# Patient Record
Sex: Female | Born: 1980 | Race: White | Hispanic: No | Marital: Married | Smoking: Never smoker
Health system: Southern US, Community
[De-identification: ages and names within clinical notes are randomized; demographics above are authoritative.]

## PROBLEM LIST (undated history)

## (undated) DIAGNOSIS — Z9889 Other specified postprocedural states: Secondary | ICD-10-CM

## (undated) DIAGNOSIS — S92902A Unspecified fracture of left foot, initial encounter for closed fracture: Secondary | ICD-10-CM

## (undated) DIAGNOSIS — E049 Nontoxic goiter, unspecified: Secondary | ICD-10-CM

## (undated) DIAGNOSIS — R112 Nausea with vomiting, unspecified: Secondary | ICD-10-CM

## (undated) DIAGNOSIS — L659 Nonscarring hair loss, unspecified: Secondary | ICD-10-CM

## (undated) DIAGNOSIS — E161 Other hypoglycemia: Secondary | ICD-10-CM

## (undated) DIAGNOSIS — F419 Anxiety disorder, unspecified: Secondary | ICD-10-CM

## (undated) HISTORY — DX: Unspecified fracture of left foot, initial encounter for closed fracture: S92.902A

## (undated) HISTORY — PX: CHOLECYSTECTOMY: SHX55

## (undated) HISTORY — PX: FINGER NAIL SURGERY: SHX717

## (undated) HISTORY — PX: CYSTECTOMY: SUR359

## (undated) HISTORY — DX: Nontoxic goiter, unspecified: E04.9

## (undated) HISTORY — PX: TONSILLECTOMY: SUR1361

## (undated) HISTORY — DX: Nonscarring hair loss, unspecified: L65.9

## (undated) HISTORY — PX: REFRACTIVE SURGERY: SHX103

## (undated) HISTORY — DX: Anxiety disorder, unspecified: F41.9

---

## 2010-06-01 ENCOUNTER — Encounter: Admission: RE | Admit: 2010-06-01 | Discharge: 2010-06-01 | Payer: Self-pay | Admitting: Obstetrics and Gynecology

## 2010-11-28 ENCOUNTER — Encounter
Admission: RE | Admit: 2010-11-28 | Discharge: 2010-11-28 | Payer: Self-pay | Source: Home / Self Care | Admitting: Endocrinology

## 2011-04-23 ENCOUNTER — Inpatient Hospital Stay (HOSPITAL_COMMUNITY): Admission: AD | Admit: 2011-04-23 | Payer: Self-pay | Source: Home / Self Care | Admitting: Obstetrics and Gynecology

## 2011-05-07 ENCOUNTER — Other Ambulatory Visit: Payer: Self-pay | Admitting: Orthopedic Surgery

## 2011-05-07 ENCOUNTER — Ambulatory Visit (HOSPITAL_BASED_OUTPATIENT_CLINIC_OR_DEPARTMENT_OTHER)
Admission: RE | Admit: 2011-05-07 | Discharge: 2011-05-07 | Disposition: A | Discharge status: Home / Self Care | Payer: Private Health Insurance - Indemnity | Source: Ambulatory Visit | Attending: Orthopedic Surgery | Admitting: Orthopedic Surgery

## 2011-05-07 DIAGNOSIS — Z01812 Encounter for preprocedural laboratory examination: Secondary | ICD-10-CM | POA: Insufficient documentation

## 2011-05-07 DIAGNOSIS — L608 Other nail disorders: Secondary | ICD-10-CM | POA: Insufficient documentation

## 2011-07-12 NOTE — Op Note (Signed)
  [name], Melissa Sawyer              ACCOUNT NO.:  [id number]  MEDICAL RECORD NO.:  [id number]           PATIENT TYPE:  LOCATION:                                FACILITY:  [facility]  PHYSICIAN:  Cindee Salt, M.D.       DATE OF BIRTH:  July 04, 1981  DATE OF PROCEDURE:  05/07/2011 DATE OF DISCHARGE:                              OPERATIVE REPORT   PREOPERATIVE DIAGNOSIS:  Melanotic lesion, left thumb nail.  POSTOPERATIVE DIAGNOSIS:  Melanotic lesion, left thumb nail.  OPERATION:  Biopsy, left thumb nail bed.  SURGEON:  Cindee Salt, MD  ANESTHESIA:  Forearm based IV regional local infiltration.  ANESTHESIOLOGIST:  Janetta Hora. Frederick, MD  HISTORY:  The patient is a 30 year old female with a history of a melanotic streak in her nail plate of her left thumb.  This has grown to the entire length of the nail plate.  She is admitted for biopsy.  Her pre, peri, and postoperative course have been discussed along with risks and complications.  She is aware of the potential for malignancy that this may be a simple melanotic streak without undue concern but biopsy is necessary.  In the preoperative area, the patient is seen.  The extremity marked by both the patient and surgeon.  PROCEDURE IN DETAIL:  The patient was brought to the operating room where a forearm based IV regional anesthetic was carried out without difficulty.  She was prepped using ChloraPrep, supine position, left arm free.  A 3-minute dry time was allowed.  Time-out taken confirming the patient and procedure.  A metacarpal block was given with 0.25% Marcaine without epinephrine, 7 mL was used.  The nail plate was removed removing the melanotic streak in the plate distally.  The melanotic lesion was identified in the proximal nail fold.  A elliptical incision was made to the entire depths of the bone.  The specimen was sent to pathology.  The area was then undermined with a new scalpel blade irrigated and closed with interrupted  6-0 chromic sutures.  A nonadherent gauze was placed into the nail fold proximally.  A sterile compressive dressing and splint applied to the thumb.  On deflation of the tourniquet, all fingers immediately pinked.  She was taken to the recovery room for observation in satisfactory condition.  She will be discharged home to return to the [facility] [place] in 1 week on Vicodin.         ______________________________ Cindee Salt, M.D.    GK/MEDQ  D:  05/07/2011  T:  05/07/2011  Job:  [id number]  Electronically Signed by Cindee Salt M.D. on 07/12/2011 09:12:51 AM

## 2013-03-16 ENCOUNTER — Telehealth: Payer: Self-pay | Admitting: Certified Nurse Midwife

## 2013-03-16 NOTE — Telephone Encounter (Signed)
pt. states she will call back to rs consult for 03/17/13 at a later time. Pt. stated she can not afford $145.00 for consult.

## 2013-03-17 ENCOUNTER — Institutional Professional Consult (permissible substitution): Payer: Self-pay | Admitting: Certified Nurse Midwife

## 2013-05-20 ENCOUNTER — Other Ambulatory Visit: Payer: Self-pay | Admitting: Endocrinology

## 2013-05-20 DIAGNOSIS — E041 Nontoxic single thyroid nodule: Secondary | ICD-10-CM

## 2013-05-20 DIAGNOSIS — E049 Nontoxic goiter, unspecified: Secondary | ICD-10-CM

## 2013-05-27 ENCOUNTER — Ambulatory Visit
Admission: RE | Admit: 2013-05-27 | Discharge: 2013-05-27 | Disposition: A | Discharge status: Home / Self Care | Payer: Managed Care, Other (non HMO) | Source: Ambulatory Visit | Attending: Endocrinology | Admitting: Endocrinology

## 2013-05-27 DIAGNOSIS — E049 Nontoxic goiter, unspecified: Secondary | ICD-10-CM

## 2013-06-22 ENCOUNTER — Encounter: Payer: Self-pay | Admitting: Certified Nurse Midwife

## 2013-06-23 ENCOUNTER — Encounter: Payer: Self-pay | Admitting: Certified Nurse Midwife

## 2013-06-23 ENCOUNTER — Ambulatory Visit (INDEPENDENT_AMBULATORY_CARE_PROVIDER_SITE_OTHER): Payer: Managed Care, Other (non HMO) | Admitting: Certified Nurse Midwife

## 2013-06-23 VITALS — BP 100/64 | HR 64 | Resp 16 | Ht 70.0 in | Wt 140.0 lb

## 2013-06-23 DIAGNOSIS — Z0000 Encounter for general adult medical examination without abnormal findings: Secondary | ICD-10-CM

## 2013-06-23 DIAGNOSIS — Z23 Encounter for immunization: Secondary | ICD-10-CM

## 2013-06-23 DIAGNOSIS — Z01419 Encounter for gynecological examination (general) (routine) without abnormal findings: Secondary | ICD-10-CM

## 2013-06-23 DIAGNOSIS — F411 Generalized anxiety disorder: Secondary | ICD-10-CM

## 2013-06-23 MED ORDER — ESCITALOPRAM OXALATE 10 MG PO TABS: 10.0000 mg | ORAL_TABLET | Freq: Every day | ORAL | Status: DC

## 2013-06-23 NOTE — Patient Instructions (Signed)
General topics  Next pap or exam is  due in 1 year Take a Women's multivitamin Take 1200 mg. of calcium daily - prefer dietary If any concerns in interim to call back  Breast Self-Awareness Practicing breast self-awareness may pick up problems early, prevent significant medical complications, and possibly save your life. By practicing breast self-awareness, you can become familiar with how your breasts look and feel and if your breasts are changing. This allows you to notice changes early. It can also offer you some reassurance that your breast health is good. One way to learn what is normal for your breasts and whether your breasts are changing is to do a breast self-exam. If you find a lump or something that was not present in the past, it is best to contact your caregiver right away. Other findings that should be evaluated by your caregiver include nipple discharge, especially if it is bloody; skin changes or reddening; areas where the skin seems to be pulled in (retracted); or new lumps and bumps. Breast pain is seldom associated with cancer (malignancy), but should also be evaluated by a caregiver. BREAST SELF-EXAM The best time to examine your breasts is 5 7 days after your menstrual period is over.  ExitCare Patient Information 2013 [employer].   Exercise to Stay Healthy Exercise helps you become and stay healthy. EXERCISE IDEAS AND TIPS Choose exercises that:  You enjoy.  Fit into your day. You do not need to exercise really hard to be healthy. You can do exercises at a slow or medium level and stay healthy. You can:  Stretch before and after working out.  Try yoga, Pilates, or tai chi.  Lift weights.  Walk fast, swim, jog, run, climb stairs, bicycle, dance, or rollerskate.  Take aerobic classes. Exercises that burn about 150 calories:  Running 1  miles in 15 minutes.  Playing volleyball for 45 to 60 minutes.  Washing and waxing a car for 45 to 60  minutes.  Playing touch football for 45 minutes.  Walking 1  miles in 35 minutes.  Pushing a stroller 1  miles in 30 minutes.  Playing basketball for 30 minutes.  Raking leaves for 30 minutes.  Bicycling 5 miles in 30 minutes.  Walking 2 miles in 30 minutes.  Dancing for 30 minutes.  Shoveling snow for 15 minutes.  Swimming laps for 20 minutes.  Walking up stairs for 15 minutes.  Bicycling 4 miles in 15 minutes.  Gardening for 30 to 45 minutes.  Jumping rope for 15 minutes.  Washing windows or floors for 45 to 60 minutes. Document Released: 01/11/2011 Document Revised: 03/02/2012 Document Reviewed: 01/11/2011 ExitCare Patient Information 2013 [employer].   Other topics ( that may be useful information):    Sexually Transmitted Disease Sexually transmitted disease (STD) refers to any infection that is passed from person to person during sexual activity. This may happen by way of saliva, semen, blood, vaginal mucus, or urine. Common STDs include:  Gonorrhea.  Chlamydia.  Syphilis.  HIV/AIDS.  Genital herpes.  Hepatitis B and C.  Trichomonas.  Human papillomavirus (HPV).  Pubic lice. CAUSES  An STD may be spread by bacteria, virus, or parasite. A person can get an STD by:  Sexual intercourse with an infected person.  Sharing sex toys with an infected person.  Sharing needles with an infected person.  Having intimate contact with the genitals, mouth, or rectal areas of an infected person. SYMPTOMS  Some people may not have any symptoms, but   they can still pass the infection to others. Different STDs have different symptoms. Symptoms include:  Painful or bloody urination.  Pain in the pelvis, abdomen, vagina, anus, throat, or eyes.  Skin rash, itching, irritation, growths, or sores (lesions). These usually occur in the genital or anal area.  Abnormal vaginal discharge.  Penile discharge in men.  Soft, flesh-colored skin growths in the  genital or anal area.  Fever.  Pain or bleeding during sexual intercourse.  Swollen glands in the groin area.  Yellow skin and eyes (jaundice). This is seen with hepatitis. DIAGNOSIS  To make a diagnosis, your caregiver may:  Take a medical history.  Perform a physical exam.  Take a specimen (culture) to be examined.  Examine a sample of discharge under a microscope.  Perform blood test TREATMENT   Chlamydia, gonorrhea, trichomonas, and syphilis can be cured with antibiotic medicine.  Genital herpes, hepatitis, and HIV can be treated, but not cured, with prescribed medicines. The medicines will lessen the symptoms.  Genital warts from HPV can be treated with medicine or by freezing, burning (electrocautery), or surgery. Warts may come back.  HPV is a virus and cannot be cured with medicine or surgery.However, abnormal areas may be followed very closely by your caregiver and may be removed from the cervix, vagina, or vulva through office procedures or surgery. If your diagnosis is confirmed, your recent sexual partners need treatment. This is true even if they are symptom-free or have a negative culture or evaluation. They should not have sex until their caregiver says it is okay. HOME CARE INSTRUCTIONS  All sexual partners should be informed, tested, and treated for all STDs.  Take your antibiotics as directed. Finish them even if you start to feel better.  Only take over-the-counter or prescription medicines for pain, discomfort, or fever as directed by your caregiver.  Rest.  Eat a balanced diet and drink enough fluids to keep your urine clear or pale yellow.  Do not have sex until treatment is completed and you have followed up with your caregiver. STDs should be checked after treatment.  Keep all follow-up appointments, Pap tests, and blood tests as directed by your caregiver.  Only use latex condoms and water-soluble lubricants during sexual activity. Do not use  petroleum jelly or oils.  Avoid alcohol and illegal drugs.  Get vaccinated for HPV and hepatitis. If you have not received these vaccines in the past, talk to your caregiver about whether one or both might be right for you.  Avoid risky sex practices that can break the skin. The only way to avoid getting an STD is to avoid all sexual activity.Latex condoms and dental dams (for oral sex) will help lessen the risk of getting an STD, but will not completely eliminate the risk. SEEK MEDICAL CARE IF:   You have a fever.  You have any new or worsening symptoms. Document Released: 03/01/2003 Document Revised: 03/02/2012 Document Reviewed: 03/08/2011 ExitCare Patient Information 2013 [employer]Care[employer].    Domestic Abuse You are being battered or abused if someone close to you hits, pushes, or physically hurts you in any way. You also are being abused if you are forced into activities. You are being sexually abused if you are forced to have sexual contact of any kind. You are being emotionally abused if you are made to feel worthless or if you are constantly threatened. It is important to remember that help is available. No one has the right to abuse you. PREVENTION OF FURTHER   ABUSE  Learn the warning signs of danger. This varies with situations but may include: the use of alcohol, threats, isolation from friends and family, or forced sexual contact. Leave if you feel that violence is going to occur.  If you are attacked or beaten, report it to the police so the abuse is documented. You do not have to press charges. The police can protect you while you or the attackers are leaving. Get the officer's name and badge number and a copy of the report.  Find someone you can trust and tell them what is happening to you: your caregiver, a nurse, clergy member, close friend or family member. Feeling ashamed is natural, but remember that you have done nothing wrong. No one deserves abuse. Document Released:  12/06/2000 Document Revised: 03/02/2012 Document Reviewed: 02/14/2011 ExitCare Patient Information 2013 [employer].    How Much is Too Much Alcohol? Drinking too much alcohol can cause injury, accidents, and health problems. These types of problems can include:   Car crashes.  Falls.  Family fighting (domestic violence).  Drowning.  Fights.  Injuries.  Burns.  Damage to certain organs.  Having a baby with birth defects. ONE DRINK CAN BE TOO MUCH WHEN YOU ARE:  Working.  Pregnant or breastfeeding.  Taking medicines. Ask your doctor.  Driving or planning to drive. If you or someone you know has a drinking problem, get help from a doctor.  Document Released: 10/05/2009 Document Revised: 03/02/2012 Document Reviewed: 10/05/2009 ExitCare Patient Information 2013 [employer].   Smoking Hazards Smoking cigarettes is extremely bad for your health. Tobacco smoke has over 200 known poisons in it. There are over 60 chemicals in tobacco smoke that cause cancer. Some of the chemicals found in cigarette smoke include:   Cyanide.  Benzene.  Formaldehyde.  Methanol (wood alcohol).  Acetylene (fuel used in welding torches).  Ammonia. Cigarette smoke also contains the poisonous gases nitrogen oxide and carbon monoxide.  Cigarette smokers have an increased risk of many serious medical problems and Smoking causes approximately:  90% of all lung cancer deaths in men.  80% of all lung cancer deaths in women.  90% of deaths from chronic obstructive lung disease. Compared with nonsmokers, smoking increases the risk of:  Coronary heart disease by 2 to 4 times.  Stroke by 2 to 4 times.  Men developing lung cancer by 23 times.  Women developing lung cancer by 13 times.  Dying from chronic obstructive lung diseases by 12 times.  . Smoking is the most preventable cause of death and disease in our society.  WHY IS SMOKING ADDICTIVE?  Nicotine is the chemical  agent in tobacco that is capable of causing addiction or dependence.  When you smoke and inhale, nicotine is absorbed rapidly into the bloodstream through your lungs. Nicotine absorbed through the lungs is capable of creating a powerful addiction. Both inhaled and non-inhaled nicotine may be addictive.  Addiction studies of cigarettes and spit tobacco show that addiction to nicotine occurs mainly during the teen years, when young people begin using tobacco products. WHAT ARE THE BENEFITS OF QUITTING?  There are many health benefits to quitting smoking.   Likelihood of developing cancer and heart disease decreases. Health improvements are seen almost immediately.  Blood pressure, pulse rate, and breathing patterns start returning to normal soon after quitting. QUITTING SMOKING   American Lung Association - 1-800-LUNGUSA  American Cancer Society - 1-800-ACS-2345 Document Released: 01/16/2005 Document Revised: 03/02/2012 Document Reviewed: 09/20/2009 ExitCare Patient Information 2013 ExitCare,   LLC.   Stress Management Stress is a state of physical or mental tension that often results from changes in your life or normal routine. Some common causes of stress are:  Death of a loved one.  Injuries or severe illnesses.  Getting fired or changing jobs.  Moving into a new home. Other causes may be:  Sexual problems.  Business or financial losses.  Taking on a large debt.  Regular conflict with someone at home or at work.  Constant tiredness from lack of sleep. It is not just bad things that are stressful. It may be stressful to:  Win the lottery.  Get married.  Buy a new car. The amount of stress that can be easily tolerated varies from person to person. Changes generally cause stress, regardless of the types of change. Too much stress can affect your health. It may lead to physical or emotional problems. Too little stress (boredom) may also become stressful. SUGGESTIONS TO  REDUCE STRESS:  Talk things over with your family and friends. It often is helpful to share your concerns and worries. If you feel your problem is serious, you may want to get help from a professional counselor.  Consider your problems one at a time instead of lumping them all together. Trying to take care of everything at once may seem impossible. List all the things you need to do and then start with the most important one. Set a goal to accomplish 2 or 3 things each day. If you expect to do too many in a single day you will naturally fail, causing you to feel even more stressed.  Do not use alcohol or drugs to relieve stress. Although you may feel better for a short time, they do not remove the problems that caused the stress. They can also be habit forming.  Exercise regularly - at least 3 times per week. Physical exercise can help to relieve that "uptight" feeling and will relax you.  The shortest distance between despair and hope is often a good night's sleep.  Go to bed and get up on time allowing yourself time for appointments without being rushed.  Take a short "time-out" period from any stressful situation that occurs during the day. Close your eyes and take some deep breaths. Starting with the muscles in your face, tense them, hold it for a few seconds, then relax. Repeat this with the muscles in your neck, shoulders, hand, stomach, back and legs.  Take good care of yourself. Eat a balanced diet and get plenty of rest.  Schedule time for having fun. Take a break from your daily routine to relax. HOME CARE INSTRUCTIONS   Call if you feel overwhelmed by your problems and feel you can no longer manage them on your own.  Return immediately if you feel like hurting yourself or someone else. Document Released: 06/04/2001 Document Revised: 03/02/2012 Document Reviewed: 01/25/2008 ExitCare Patient Information 2013 ExitCare, LLC.   

## 2013-06-23 NOTE — Progress Notes (Signed)
32 y.o. G80P1001 Married Caucasian Fe here for annual exam. Periods normal. No issues. Has now weaned baby and counselor has recommended she consider medication for anxiety.Spouse is also in counseling. Patient was on medication years ago for anxiety with out problems. Patient describes feeling anxious to the point of wringing her hands constantly, and sleeping lightly now. Denies thought of self harm or others. "Feel like I am on a short fuse, most of the time". Patient finds herself wanting to stay at home rather than be social. Nutrition is normal, no appetite changes. Infrequent crying episodes. Denies history of seizures or other neurological problems. Happy with Micronor, periods are normal without cramps, desires continuance. Saw PCP recently for lab work and had TSH evaluated, all normal per patient.  No other health issues today  Patient's last menstrual period was 06/16/2013.          Sexually active: yes  The current method of family planning is oral progesterone-only contraceptive.    Exercising: yes  swimming & walking Smoker:  no  Health Maintenance: Pap:  Per patient 05/2012 neg MMG: none Colonoscopy:  none BMD:   none TDaP:  2004  Labs: none Self breast exam: monthly   reports that she has never smoked. She does not have any smokeless tobacco history on file. She reports that she drinks about 1.5 ounces of alcohol per week. She reports that she does not use illicit drugs.  Past Medical History  Diagnosis Date  . Enlarged thyroid     sees dr Romero Belling  . Alopecia     resolved  . Anxiety     Past Surgical History  Procedure Laterality Date  . Tonsillectomy    . Cystectomy  age 47    left hand    Current Outpatient Prescriptions  Medication Sig Dispense Refill  . IBUPROFEN PO Take by mouth as needed.      . Loratadine (CLARITIN PO) Take by mouth.      . norethindrone (MICRONOR,CAMILA,ERRIN) 0.35 MG tablet daily.      Marland Kitchen UNABLE TO FIND daily. Allergy eye drops       No  current facility-administered medications for this visit.    Family History  Problem Relation Age of Onset  . Thyroid disease Mother   . Cancer Mother     thyroid  . Mitral valve prolapse Mother     ROS:  Pertinent items are noted in HPI.  Otherwise, a comprehensive ROS was negative.  Exam:   BP 100/64  Pulse 64  Resp 16  Ht 5\' 10"  (1.778 m)  Wt 140 lb (63.504 kg)  BMI 20.09 kg/m2  LMP 06/16/2013 Height: 5\' 10"  (177.8 cm)  Ht Readings from Last 3 Encounters:  06/23/13 5\' 10"  (1.778 m)    General appearance: alert, cooperative and appears stated age Head: Normocephalic, without obvious abnormality, atraumatic Neck: no adenopathy, supple, symmetrical, trachea midline and thyroid normal to inspection and palpation Lungs: clear to auscultation bilaterally Breasts: normal appearance, no masses or tenderness, No nipple retraction or dimpling, No nipple discharge or bleeding, No axillary or supraclavicular adenopathy Heart: regular rate and rhythm Abdomen: soft, non-tender; no masses,  no organomegaly Extremities: extremities normal, atraumatic, no cyanosis or edema Skin: Skin color, texture, turgor normal. No rashes or lesions Lymph nodes: Cervical, supraclavicular, and axillary nodes normal. No abnormal inguinal nodes palpated Neurologic: Grossly normal   Pelvic: External genitalia:  no lesions              Urethra:  normal appearing urethra with no masses, tenderness or lesions              Bartholin's and Skene's: normal                 Vagina: normal appearing vagina with normal color and discharge, no lesions              Cervix: normal, parous, non tender              Pap taken: yes Bimanual Exam:  Uterus:  normal size, contour, position, consistency, mobility, non-tender and anteverted              Adnexa: normal adnexa and no mass, fullness, tenderness               Rectovaginal: Confirms               Anus:  normal sphincter tone, no lesions  A:  Well Woman with  normal exam  Contraception Micronor  Anxiety in counseling, desires medication use again  Immunization update  P:  Reviewed health and wellness pertinent to exam  Rx Micronor see order  Discussed risks, benefits and side effects of Lexapro use, expectations and warning signs and symptoms. Patient would like to try to see if "she can feel better".Instructed to continue counseling and have counselor update [place] on her progress. Rx Lexapro see order.   Requests TDAP   Pap smear as per guidelines   pap smear taken today with HPVHR  counseled on breast self exam, adequate intake of calcium and vitamin D, diet and exercise  RV 2 weeks for medication evaluation  An After Visit Summary was printed and given to the patient.

## 2013-06-27 NOTE — Progress Notes (Signed)
Reviewed CPRomine, MD 

## 2013-07-05 ENCOUNTER — Telehealth: Payer: Self-pay | Admitting: Certified Nurse Midwife

## 2013-07-05 NOTE — Telephone Encounter (Signed)
Patient canceled her upcoming 3 week medication reck appointment 07/14/13. Patient will call later to reschedule after she starts the medication.Marland Kitchen

## 2013-07-13 ENCOUNTER — Encounter: Payer: Self-pay | Admitting: Certified Nurse Midwife

## 2013-07-14 ENCOUNTER — Ambulatory Visit: Payer: Managed Care, Other (non HMO) | Admitting: Certified Nurse Midwife

## 2013-07-20 ENCOUNTER — Ambulatory Visit: Payer: Managed Care, Other (non HMO) | Admitting: Certified Nurse Midwife

## 2013-08-02 ENCOUNTER — Encounter: Payer: Self-pay | Admitting: Certified Nurse Midwife

## 2013-08-24 ENCOUNTER — Telehealth: Payer: Self-pay | Admitting: Certified Nurse Midwife

## 2013-08-24 NOTE — Telephone Encounter (Signed)
Patient left message at lunch re: "Extreme exhaustion over the last few days with dark, bloody discharge and a grape-size clot this morning." Please advise?

## 2013-08-24 NOTE — Telephone Encounter (Signed)
Spoke with pt who started having exhaustion unusual for her for about 3 days, then noticed some black discharge with clots this morning. Pt reports she is on the mini pill. Sched OV tomorrow with DL at 2 pm.

## 2013-08-25 ENCOUNTER — Ambulatory Visit (INDEPENDENT_AMBULATORY_CARE_PROVIDER_SITE_OTHER): Payer: Managed Care, Other (non HMO) | Admitting: Certified Nurse Midwife

## 2013-08-25 VITALS — BP 90/60 | HR 72 | Resp 20 | Ht 70.0 in | Wt 145.0 lb

## 2013-08-25 DIAGNOSIS — N949 Unspecified condition associated with female genital organs and menstrual cycle: Secondary | ICD-10-CM

## 2013-08-25 DIAGNOSIS — R5381 Other malaise: Secondary | ICD-10-CM

## 2013-08-25 DIAGNOSIS — R5383 Other fatigue: Secondary | ICD-10-CM

## 2013-08-25 MED ORDER — NORETHIN ACE-ETH ESTRAD-FE 1-20 MG-MCG(24) PO CHEW: 1.0000 | CHEWABLE_TABLET | Freq: Every day | ORAL | Status: DC

## 2013-08-25 NOTE — Progress Notes (Signed)
32 y.o. Married Caucasian female G2P2002 with LMP 08/25/13, here complaining of dark thick brown bleeding with contraceptive use of Micronor. Using contraception method as prescribed.  Denies inconsistent use.  No new medications. Patient has now stopped nursing and also feels she is not herself.  Feels like PMS all the time. She was given Lexapro trial, and felt she was to drowsy with use. "Doesn't feel like anxiety now, just PMS". No other health issues, just want to feel better and know when my period will occur, so unpredictable with Micronor. No thoughts of self harm or others.  No other health issues.  [place]: Healthy WN WD female   Alert oriented x3 Skin:warm and dry Thyroid:normal to inspection and palpation Pelvic exam: External genital area: normal , no lesions BUS: neg Vagina: small amount of red blood noted Cervix: non tender, normal, scant blood from cervix noted Uterus: normal size and shape, non tender Adnexa: normal, non tender, no masses palpable  A:Normal Pelvic exam 2-Contraception Micronor due to lactation but has weaned child now. 3-PMS  P:Reviewed findings of normal pelvic exam 2-Discussed starting on OCP which should help with normal periods and better hormonal balance. Patient feels good choice.  Rx Minastrin 24 fe see order Discussed transition phase with change  Rv  3 months, prn

## 2013-08-30 NOTE — Telephone Encounter (Signed)
Patient has been having Vertigo for the last 18 hours. Please call, she is going out of town this afternoon.

## 2013-08-30 NOTE — Progress Notes (Signed)
Note reviewed, agree with plan.  Veora Fonte, MD  

## 2013-08-30 NOTE — Telephone Encounter (Signed)
Spoke with patient she is having vertigo symptoms, pt asking for a Rx does not have a PCP I suggested that she have someone take her to a Urgent Care to be seen. This is not something we Normally treat. Pt was ok with this.

## 2013-10-28 ENCOUNTER — Other Ambulatory Visit: Payer: Self-pay

## 2014-01-13 ENCOUNTER — Telehealth: Payer: Self-pay | Admitting: Certified Nurse Midwife

## 2014-01-13 NOTE — Telephone Encounter (Signed)
Patient has applied for life insurance, and her history of Lexapro has increased her rate.  Patient wants to make sure that Eunice BlaseDebbie includes that she is no longer taking Lexapro on the questionnaire that Eunice BlaseDebbie receives.

## 2014-03-04 ENCOUNTER — Telehealth: Payer: Self-pay | Admitting: Certified Nurse Midwife

## 2014-03-04 NOTE — Telephone Encounter (Signed)
Routing to DL as patient is requesting a phone call.

## 2014-03-04 NOTE — Telephone Encounter (Signed)
Patient would like to talk with Melissa Sawyer regarding a letter that was sent from her life insurance company. Patient is wanting [name]'s decision regarding the letter.

## 2014-03-07 ENCOUNTER — Telehealth: Payer: Self-pay | Admitting: Certified Nurse Midwife

## 2014-03-07 ENCOUNTER — Ambulatory Visit: Payer: Managed Care, Other (non HMO) | Admitting: Physician Assistant

## 2014-03-07 VITALS — BP 102/60 | HR 66 | Temp 99.7°F | Resp 16 | Ht 70.0 in | Wt 147.0 lb

## 2014-03-07 DIAGNOSIS — R1011 Right upper quadrant pain: Secondary | ICD-10-CM

## 2014-03-07 LAB — COMPREHENSIVE METABOLIC PANEL
ALBUMIN: 4.3 g/dL (ref 3.5–5.2)
ALK PHOS: 43 U/L (ref 39–117)
ALT: 20 U/L (ref 0–35)
AST: 19 U/L (ref 0–37)
BUN: 7 mg/dL (ref 6–23)
CALCIUM: 9.1 mg/dL (ref 8.4–10.5)
CHLORIDE: 100 meq/L (ref 96–112)
CO2: 31 meq/L (ref 19–32)
Creat: 0.58 mg/dL (ref 0.50–1.10)
Glucose, Bld: 85 mg/dL (ref 70–99)
POTASSIUM: 3.8 meq/L (ref 3.5–5.3)
Sodium: 138 mEq/L (ref 135–145)
Total Bilirubin: 0.3 mg/dL (ref 0.2–1.2)
Total Protein: 7.3 g/dL (ref 6.0–8.3)

## 2014-03-07 LAB — POCT CBC
Granulocyte percent: 63.2 %G (ref 37–80)
HCT, POC: 39.7 % (ref 37.7–47.9)
Hemoglobin: 12.7 g/dL (ref 12.2–16.2)
LYMPH, POC: 1.3 (ref 0.6–3.4)
MCH: 29.3 pg (ref 27–31.2)
MCHC: 32 g/dL (ref 31.8–35.4)
MCV: 91.6 fL (ref 80–97)
MID (cbc): 0.3 (ref 0–0.9)
MPV: 8.3 fL (ref 0–99.8)
POC GRANULOCYTE: 2.8 (ref 2–6.9)
POC LYMPH %: 30.4 % (ref 10–50)
POC MID %: 6.4 %M (ref 0–12)
Platelet Count, POC: 251 10*3/uL (ref 142–424)
RBC: 4.33 M/uL (ref 4.04–5.48)
RDW, POC: 12.6 %
WBC: 4.4 10*3/uL — AB (ref 4.6–10.2)

## 2014-03-07 NOTE — Telephone Encounter (Signed)
Discussed with patient this am

## 2014-03-07 NOTE — Telephone Encounter (Signed)
Spoke with patient regarding insurance denial of good rate due to Lexapro trial. Patient to send information per my chart email for appeal and patient only used medication for 3 days and is not on medication. Discussed with patient will submit what is current. Patient in urgent care at present with? Gallstone. Expressed my concern that her status improve.

## 2014-03-07 NOTE — Patient Instructions (Signed)
I have put in an order for you to have an ultrasound of your abdomen - this will hopefully visualize any gallstones that may have caused your pain  Foods to limit:     Fried foods     Highly processed foods (doughnuts, pie, cookies)     Whole-milk dairy products (cheese, ice cream, butter)     Fatty red meat   Cholelithiasis Cholelithiasis (also called gallstones) is a form of gallbladder disease in which gallstones form in your gallbladder. The gallbladder is an organ that stores bile made in the liver, which helps digest fats. Gallstones begin as small crystals and slowly grow into stones. Gallstone pain occurs when the gallbladder spasms and a gallstone is blocking the duct. Pain can also occur when a stone passes out of the duct.  RISK FACTORS  Being female.   Having multiple pregnancies. Health care providers sometimes advise removing diseased gallbladders before future pregnancies.   Being obese.  Eating a diet heavy in fried foods and fat.   Being older than 60 years and increasing age.   Prolonged use of medicines containing female hormones.   Having diabetes mellitus.   Rapidly losing weight.   Having a family history of gallstones (heredity).  SYMPTOMS  Nausea.   Vomiting.  Abdominal pain.   Yellowing of the skin (jaundice).   Sudden pain. It may persist from several minutes to several hours.  Fever.   Tenderness to the touch. In some cases, when gallstones do not move into the bile duct, people have no pain or symptoms. These are called "silent" gallstones.  TREATMENT Silent gallstones do not need treatment. In severe cases, emergency surgery may be required. Options for treatment include:  Surgery to remove the gallbladder. This is the most common treatment.  Medicines. These do not always work and may take 6 12 months or more to work.  Shock wave treatment (extracorporeal biliary lithotripsy). In this treatment an ultrasound machine sends  shock waves to the gallbladder to break gallstones into smaller pieces that can pass into the intestines or be dissolved by medicine. HOME CARE INSTRUCTIONS   Only take over-the-counter or prescription medicines for pain, discomfort, or fever as directed by your health care provider.   Follow a low-fat diet until seen again by your health care provider. Fat causes the gallbladder to contract, which can result in pain.   Follow up with your health care provider as directed. Attacks are almost always recurrent and surgery is usually required for permanent treatment.  SEEK IMMEDIATE MEDICAL CARE IF:   Your pain increases and is not controlled by medicines.   You have a fever or persistent symptoms for more than 2 3 days.   You have a fever and your symptoms suddenly get worse.   You have persistent nausea and vomiting.  MAKE SURE YOU:   Understand these instructions.  Will watch your condition.  Will get help right away if you are not doing well or get worse. Document Released: 12/05/2005 Document Revised: 08/11/2013 Document Reviewed: 06/02/2013 [facility] Patient Information 2014 [place].

## 2014-03-07 NOTE — Progress Notes (Signed)
Subjective:    Patient ID: Melissa Sawyer, female    DOB: [birth year], 33 y.o.   MRN: [id number]  HPI    Melissa Sawyer is a very pleasant 33 yr old female here for evaluation after having a "strange episode" two days ago.  Had just eaten lunch with her two children - ate mac and cheese, hot dogs, carrots - when all of a sudden her "stomach locked up."  Extreme pain - near child birth level pain - in her abdomen.  Thought that she might be getting a stomach bug but never had NVD - tried to vomit but had no relief.  Hard to breathe because of the pain.  The episode lasted about 20-30 minutes then spontaneously and completed resolved.  She has felt completely fine since.  No further abd pain.  No NVD.  Normal bowel movements since that time.  Reports a history of "bowel issues" but nothing like this.  No prior gallbladder problems.  No fam hx gallbladder dz.  Melissa Sawyer does report a diet high in fat and protein due to hypoglycemia.  She has taken OCPs for years but is currently on a low dose pill   Review of Systems  Constitutional: Negative for fever and chills.  HENT: Negative.   Respiratory: Negative.   Cardiovascular: Negative.   Gastrointestinal: Positive for abdominal pain (two days ago, resolved). Negative for nausea, vomiting, diarrhea and constipation.  Genitourinary: Negative.   Musculoskeletal: Negative.   Skin: Negative.        Objective:   Physical Exam  Vitals reviewed. Constitutional: She is oriented to person, place, and time. She appears well-developed and well-nourished. No distress.  HENT:  Head: Normocephalic and atraumatic.  Eyes: Conjunctivae are normal. No scleral icterus.  Cardiovascular: Normal rate, regular rhythm and normal heart sounds.   Pulmonary/Chest: Effort normal and breath sounds normal. She has no wheezes. She has no rales.  Abdominal: Soft. Bowel sounds are normal. There is no tenderness. There is negative Murphy's sign.  Neurological: She is alert and oriented to  person, place, and time.  Skin: Skin is warm and dry.  Psychiatric: She has a normal mood and affect. Her behavior is normal.    Results for orders placed in visit on 03/07/14  POCT CBC      Result Value Ref Range   WBC 4.4 (*) 4.6 - 10.2 K/uL   Lymph, poc 1.3  0.6 - 3.4   POC LYMPH PERCENT 30.4  10 - 50 %L   MID (cbc) 0.3  0 - 0.9   POC MID % 6.4  0 - 12 %M   POC Granulocyte 2.8  2 - 6.9   Granulocyte percent 63.2  37 - 80 %G   RBC 4.33  4.04 - 5.48 M/uL   Hemoglobin 12.7  12.2 - 16.2 g/dL   HCT, POC 21.3  08.6 - 47.9 %   MCV 91.6  80 - 97 fL   MCH, POC 29.3  27 - 31.2 pg   MCHC 32.0  31.8 - 35.4 g/dL   RDW, POC 57.8     Platelet Count, POC 251  142 - 424 K/uL   MPV 8.3  0 - 99.8 fL       Assessment & Plan:  RUQ pain - Plan: POCT CBC, Comprehensive metabolic panel, US Abdomen Limited RUQ   Melissa Sawyer is a very pleasant 33 yr old female here after one episode of severe RUQ pain.  Suspicious for gallbladder disease.  Melissa Sawyer  is female, has two children, admits to a high fat diet, taking OCPs - all of which are RFs.  Discussed gallbladder dz with Melissa Sawyer including stones vs functional disease.  This may be an isolated event as Melissa Sawyer may have just passed a large gallstone, but also may become an ongoing issue that requires surgical intervention.  CBC is normal today, as is exam, so doubt acute cholecysitis.  CMP pending.  Will get RUQ u/s.  Discussed the possibility of HIDA scan if u/s unremarkable.  Encouraged Melissa Sawyer to limit high fat foods for the time being.  Discussed the possible role of OCPs, but Melissa Sawyer is on a low dose pill, and at this point I do not think we need to make a change.  Discussed RTC/ED precautions  Melissa Sawyer to call or RTC if worsening or not improving  E. Frances FurbishElizabeth Zamyiah Sawyer MHS, PA-C [facility] 3/16/20158:03 PM

## 2014-03-08 ENCOUNTER — Encounter: Payer: Self-pay | Admitting: Certified Nurse Midwife

## 2014-03-10 ENCOUNTER — Encounter: Payer: Self-pay | Admitting: Physician Assistant

## 2014-03-10 ENCOUNTER — Ambulatory Visit
Admission: RE | Admit: 2014-03-10 | Discharge: 2014-03-10 | Disposition: A | Discharge status: Home / Self Care | Payer: Managed Care, Other (non HMO) | Source: Ambulatory Visit | Attending: Physician Assistant | Admitting: Physician Assistant

## 2014-03-10 DIAGNOSIS — R1011 Right upper quadrant pain: Secondary | ICD-10-CM

## 2014-03-10 DIAGNOSIS — R9389 Abnormal findings on diagnostic imaging of other specified body structures: Secondary | ICD-10-CM

## 2014-03-10 DIAGNOSIS — N3281 Overactive bladder: Secondary | ICD-10-CM

## 2014-03-10 DIAGNOSIS — K8020 Calculus of gallbladder without cholecystitis without obstruction: Secondary | ICD-10-CM

## 2014-03-11 NOTE — Telephone Encounter (Signed)
See MyChart message.  In brief, pt wishes to proceed with gen surg eval of cholelithiasis.    Incidentally discovered caliectasis on RUQ u/s- pt reports fam hx kidney problems, would like to evaluated further.  Will get renal u/s.  Pt reports OAB sxs as well, so will start referral to uro, possibly ref to nephro depending on u/s results.

## 2014-03-18 ENCOUNTER — Encounter (INDEPENDENT_AMBULATORY_CARE_PROVIDER_SITE_OTHER): Payer: Self-pay | Admitting: Surgery

## 2014-03-18 ENCOUNTER — Ambulatory Visit (INDEPENDENT_AMBULATORY_CARE_PROVIDER_SITE_OTHER): Payer: Managed Care, Other (non HMO) | Admitting: Surgery

## 2014-03-18 VITALS — BP 108/68 | HR 64 | Temp 98.2°F | Resp 12 | Ht 70.0 in | Wt 144.6 lb

## 2014-03-18 DIAGNOSIS — K8010 Calculus of gallbladder with chronic cholecystitis without obstruction: Secondary | ICD-10-CM

## 2014-03-18 NOTE — Progress Notes (Signed)
Patient ID: Melissa Sawyer, female   DOB: 10/25/1981, 33 y.o.   MRN: [id number]  Chief Complaint  Patient presents with  . New Evaluation    gallstones    HPI Melissa KandJennifer Thielen is a 33 y.o. female.  Referred by Elsie StainEleanore Egan, PA-C for evaluation of gallbladder disease HPI This is a healthy 33 year old female who presents with a recent severe attack of epigastric abdominal pain that spread around her and her abdomen to her back. This was associated with diarrhea and nausea as well as significant bloating. The pain occurred after eating hot dogs as well as macaroni and cheese. The episode lasted about 20 minutes. The patient has had long history of postprandial diarrhea. Over the last year or so she has developed more postprandial abdominal bloating and increased flatulence. She was evaluated at urgent care and was referred for labs and an ultrasound. This confirmed gallstones but no sign of choledocholithiasis. She presents now for surgical evaluation. She continues to have intermittent mild symptoms. Past Medical History  Diagnosis Date  . Enlarged thyroid     sees dr Romero Bellingbalin  . Alopecia   . Anxiety     Past Surgical History  Procedure Laterality Date  . Tonsillectomy    . Cystectomy  age 33    left hand  . Finger nail surgery      removed thumb nail    Family History  Problem Relation Age of Onset  . Thyroid disease Mother   . Cancer Mother     thyroid  . Mitral valve prolapse Mother     Social History History  Substance Use Topics  . Smoking status: Never Smoker   . Smokeless tobacco: Not on file  . Alcohol Use: 1.5 oz/week    3 drink(s) per week    Allergies  Allergen Reactions  . Dicloxacillin Rash    Current Outpatient Prescriptions  Medication Sig Dispense Refill  . BIOTIN PO Take by mouth daily.      . IBUPROFEN PO Take by mouth as needed.      . Norethin Ace-Eth Estrad-FE (MINASTRIN 24 FE) 1-20 MG-MCG(24) CHEW Chew 1 tablet by mouth daily.  28 tablet  8   No  current facility-administered medications for this visit.    Review of Systems Review of Systems  Constitutional: Negative for fever, chills and unexpected weight change.  HENT: Negative for congestion, hearing loss, sore throat, trouble swallowing and voice change.   Eyes: Negative for visual disturbance.  Respiratory: Negative for cough and wheezing.   Cardiovascular: Negative for chest pain, palpitations and leg swelling.  Gastrointestinal: Positive for nausea, abdominal pain, diarrhea and abdominal distention. Negative for vomiting, constipation, blood in stool and anal bleeding.  Genitourinary: Negative for hematuria, vaginal bleeding and difficulty urinating.  Musculoskeletal: Negative for arthralgias.  Skin: Negative for rash and wound.  Neurological: Negative for seizures, syncope and headaches.  Hematological: Negative for adenopathy. Does not bruise/bleed easily.  Psychiatric/Behavioral: Negative for confusion.    Blood pressure 108/68, pulse 64, temperature 98.2 F (36.8 C), resp. rate 12, height 5\' 10"  (1.778 m), weight 144 lb 9.6 oz (65.59 kg), last menstrual period 02/07/2014.  Physical Exam Physical Exam WDWN in NAD HEENT:  EOMI, sclera anicteric Neck:  No masses, no thyromegaly Lungs:  CTA bilaterally; normal respiratory effort CV:  Regular rate and rhythm; no murmurs Abd:  +bowel sounds, soft, non-tender, no masses Ext:  Well-perfused; no edema Skin:  Warm, dry; no sign of jaundice  Data Reviewed Lab Results  Component Value Date   WBC 4.4* 03/07/2014   HGB 12.7 03/07/2014   HCT 39.7 03/07/2014   MCV 91.6 03/07/2014   Lab Results  Component Value Date   CREATININE 0.58 03/07/2014   BUN 7 03/07/2014   NA 138 03/07/2014   K 3.8 03/07/2014   CL 100 03/07/2014   CO2 31 03/07/2014   Lab Results  Component Value Date   ALT 20 03/07/2014   AST 19 03/07/2014   ALKPHOS 43 03/07/2014   BILITOT 0.3 03/07/2014   [place] Abdomen Limited Ruq  03/10/2014   CLINICAL DATA:   Severe right upper quadrant pain  EXAM: US ABDOMEN LIMITED - RIGHT UPPER QUADRANT  COMPARISON:  None.  FINDINGS: Gallbladder:  Two dominant gallstones measuring up to 2.3 cm. Additional layering debris/smaller stones. No gallbladder wall thickening or pericholecystic fluid. Negative sonographic Murphy's sign.  Common bile duct:  Diameter: 3 mm.  Liver:  No focal lesion identified. Within normal limits in parenchymal echogenicity.  Additional comments: Incidentally noted is suspected caliectasis in the right upper kidney.  IMPRESSION: Cholelithiasis, without associated sonographic findings to suggest acute cholecystitis.   Electronically Signed   By: Charline BillsSriyesh  Krishnan M.D.   On: 03/10/2014 08:29      Assessment    Chronic calculus cholecystitis     Plan    Laparoscopic cholecystectomy with intraoperative cholangiogram. The surgical procedure has been discussed with the patient.  Potential risks, benefits, alternative treatments, and expected outcomes have been explained.  All of the patient's questions at this time have been answered.  The likelihood of reaching the patient's treatment goal is good.  The patient understand the proposed surgical procedure and wishes to proceed.         Teniyah Seivert K. 03/18/2014, 11:55 AM

## 2014-03-21 ENCOUNTER — Ambulatory Visit
Admission: RE | Admit: 2014-03-21 | Discharge: 2014-03-21 | Disposition: A | Discharge status: Home / Self Care | Payer: Managed Care, Other (non HMO) | Source: Ambulatory Visit | Attending: Physician Assistant | Admitting: Physician Assistant

## 2014-03-21 DIAGNOSIS — R9389 Abnormal findings on diagnostic imaging of other specified body structures: Secondary | ICD-10-CM

## 2014-03-23 ENCOUNTER — Encounter (INDEPENDENT_AMBULATORY_CARE_PROVIDER_SITE_OTHER): Payer: Self-pay | Admitting: Surgery

## 2014-03-24 ENCOUNTER — Encounter (HOSPITAL_COMMUNITY): Payer: Self-pay | Admitting: Pharmacy Technician

## 2014-03-31 NOTE — Pre-Procedure Instructions (Signed)
Arman BogusJennifer H Anstey  03/31/2014   Your procedure is scheduled on:  Monday, April 13.  [facility]/Entrance "A" at 8:00AM.  Call this number if you have problems the morning of surgery: [phone]   Remember:   Do not eat food or drink liquids after midnight Sunday.   Take these medicines the morning of surgery with A SIP OF WATER: cetirizine (ZYRTEC), Norethin Ace-Eth Estrad-FE .              Stop taking Aspirin, Coumadin, Plavix, Effient and Herbal medications.  Don not take any NSAIDs ie: Ibuprofen,  Advil,Naproxen or any medication containing Aspirin.     Do not wear jewelry, make-up or nail polish.  Do not wear lotions, powders, or perfumes. You may wear deodorant.  Do not shave 48 hours prior to surgery. Men may shave face and neck.  Do not bring valuables to the hospital.  [facility] is not responsible  for any belongings or valuables.               Contacts, dentures or bridgework may not be worn into surgery.  Leave suitcase in the car. After surgery it may be brought to your room.  For patients admitted to the hospital, discharge time is determined by your treatment team.               Patients discharged the day of surgery will not be allowed to drive home.  Name and phone number of your driver:    Special Instructions: Review  [place] - Preparing For Surgery.   Please read over the following fact sheets that you were given: Pain Booklet, Coughing and Deep Breathing and Surgical Site Infection Prevention

## 2014-04-01 ENCOUNTER — Encounter (HOSPITAL_COMMUNITY): Payer: Self-pay

## 2014-04-01 ENCOUNTER — Encounter (HOSPITAL_COMMUNITY): Payer: Self-pay | Admitting: Emergency Medicine

## 2014-04-01 ENCOUNTER — Emergency Department (HOSPITAL_COMMUNITY)
Admission: EM | Admit: 2014-04-01 | Discharge: 2014-04-01 | Disposition: A | Discharge status: Home / Self Care | Payer: Managed Care, Other (non HMO) | Attending: Emergency Medicine | Admitting: Emergency Medicine

## 2014-04-01 ENCOUNTER — Encounter (HOSPITAL_COMMUNITY)
Admission: RE | Admit: 2014-04-01 | Discharge: 2014-04-01 | Disposition: A | Discharge status: Home / Self Care | Payer: Managed Care, Other (non HMO) | Source: Ambulatory Visit | Attending: Surgery | Admitting: Surgery

## 2014-04-01 ENCOUNTER — Emergency Department (HOSPITAL_COMMUNITY): Payer: Managed Care, Other (non HMO)

## 2014-04-01 DIAGNOSIS — Z8659 Personal history of other mental and behavioral disorders: Secondary | ICD-10-CM | POA: Insufficient documentation

## 2014-04-01 DIAGNOSIS — Z862 Personal history of diseases of the blood and blood-forming organs and certain disorders involving the immune mechanism: Secondary | ICD-10-CM | POA: Insufficient documentation

## 2014-04-01 DIAGNOSIS — Y92009 Unspecified place in unspecified non-institutional (private) residence as the place of occurrence of the external cause: Secondary | ICD-10-CM | POA: Insufficient documentation

## 2014-04-01 DIAGNOSIS — Z872 Personal history of diseases of the skin and subcutaneous tissue: Secondary | ICD-10-CM | POA: Insufficient documentation

## 2014-04-01 DIAGNOSIS — W1809XA Striking against other object with subsequent fall, initial encounter: Secondary | ICD-10-CM | POA: Insufficient documentation

## 2014-04-01 DIAGNOSIS — Y9301 Activity, walking, marching and hiking: Secondary | ICD-10-CM | POA: Insufficient documentation

## 2014-04-01 DIAGNOSIS — Z79899 Other long term (current) drug therapy: Secondary | ICD-10-CM | POA: Insufficient documentation

## 2014-04-01 DIAGNOSIS — S161XXA Strain of muscle, fascia and tendon at neck level, initial encounter: Secondary | ICD-10-CM

## 2014-04-01 DIAGNOSIS — S139XXA Sprain of joints and ligaments of unspecified parts of neck, initial encounter: Secondary | ICD-10-CM | POA: Insufficient documentation

## 2014-04-01 DIAGNOSIS — Z8639 Personal history of other endocrine, nutritional and metabolic disease: Secondary | ICD-10-CM | POA: Insufficient documentation

## 2014-04-01 HISTORY — DX: Nausea with vomiting, unspecified: R11.2

## 2014-04-01 HISTORY — DX: Other hypoglycemia: E16.1

## 2014-04-01 HISTORY — DX: Other specified postprocedural states: Z98.890

## 2014-04-01 LAB — CBC
HCT: 40 % (ref 36.0–46.0)
Hemoglobin: 13.6 g/dL (ref 12.0–15.0)
MCH: 29.8 pg (ref 26.0–34.0)
MCHC: 34 g/dL (ref 30.0–36.0)
MCV: 87.7 fL (ref 78.0–100.0)
PLATELETS: 168 10*3/uL (ref 150–400)
RBC: 4.56 MIL/uL (ref 3.87–5.11)
RDW: 12.5 % (ref 11.5–15.5)
WBC: 5.4 10*3/uL (ref 4.0–10.5)

## 2014-04-01 LAB — HCG, SERUM, QUALITATIVE: Preg, Serum: NEGATIVE

## 2014-04-01 MED ORDER — OXYCODONE-ACETAMINOPHEN 5-325 MG PO TABS
1.0000 | ORAL_TABLET | Freq: Once | ORAL | Status: AC
  Administered 2014-04-01: 1 via ORAL
  Filled 2014-04-01: qty 1

## 2014-04-01 NOTE — ED Notes (Signed)
Patient transported to X-ray.  Walked with assistance.  Pt very fearful of getting up OOB and walking but was able to with assistance.

## 2014-04-01 NOTE — ED Provider Notes (Signed)
CSN: [id number]     Arrival date & time 04/01/14  1939 History   First MD Initiated Contact with Patient 04/01/14 2020     Chief Complaint  Patient presents with  . Fall  . Neck Injury      Patient is a 33 y.o. female presenting with fall and neck injury. The history is provided by the patient.  Fall This is a new problem. Episode onset: just prior to arrival. The problem occurs constantly. Pertinent negatives include no chest pain, no abdominal pain, no headaches and no shortness of breath. The symptoms are aggravated by walking. The symptoms are relieved by rest.  Neck Injury Pertinent negatives include no chest pain, no abdominal pain, no headaches and no shortness of breath.  pt reports she was walking at home and she was rushing through house when she fell and injured her neck No LOC No HA No cp/sob/dizziness No abd pain No weakness reported She reports significant neck pain   Past Medical History  Diagnosis Date  . Enlarged thyroid     sees dr Romero Belling  . Alopecia   . Anxiety   . Hypoglycemic reaction     has severe reaction if blood sugar drops  . PONV (postoperative nausea and vomiting)     motion sickness,extreme nausea   Past Surgical History  Procedure Laterality Date  . Tonsillectomy    . Cystectomy  age 43    left hand  . Finger nail surgery      removed thumb nail   Family History  Problem Relation Age of Onset  . Thyroid disease Mother   . Cancer Mother     thyroid  . Mitral valve prolapse Mother    History  Substance Use Topics  . Smoking status: Never Smoker   . Smokeless tobacco: Not on file  . Alcohol Use: Yes     Comment: occasionally   OB History   Grav Para Term Preterm Abortions TAB SAB Ect Mult Living   2 2 2       2      Review of Systems  Respiratory: Negative for shortness of breath.   Cardiovascular: Negative for chest pain.  Gastrointestinal: Negative for vomiting and abdominal pain.  Musculoskeletal: Positive for neck pain.  Negative for back pain.  Neurological: Negative for weakness and headaches.  All other systems reviewed and are negative.     Allergies  Doxycycline  Home Medications   Current Outpatient Rx  Name  Route  Sig  Dispense  Refill  . cetirizine (ZYRTEC) 10 MG tablet   Oral   Take 10 mg by mouth daily.         . Norethin Ace-Eth Estrad-FE (MINASTRIN 24 FE) 1-20 MG-MCG(24) CHEW   Oral   Chew 1 tablet by mouth daily.   28 tablet   8    BP 106/69  Pulse 71  Temp(Src) 98.5 F (36.9 C) (Oral)  SpO2 100%  LMP 02/07/2014 Physical Exam CONSTITUTIONAL: Well developed/well nourished HEAD: Normocephalic/atraumatic EYES: EOMI/PERRL ENMT: Mucous membranes moist, No evidence of facial/nasal trauma NECK: supple no meningeal signs SPINE:cervical spine tenderness.  No thoracic/lumbar tenderness.  No bruising/crepitance/stepoffs noted to spine Patient maintained in spinal precautions/logroll utilized CV: S1/S2 noted, no murmurs/rubs/gallops noted LUNGS: Lungs are clear to auscultation bilaterally, no apparent distress ABDOMEN: soft, nontender, no rebound or guarding GU:no cva tenderness NEURO: Pt is awake/alert, moves all extremitiesx4 EXTREMITIES: pulses normal, full ROM, All extremities/joints palpated/ranged and nontender, pelvis stable SKIN: warm, color normal  PSYCH: no abnormalities of mood noted  ED Course  Procedures   10:07 PM Pt improved She is ambulatory She has no focal motor deficit in her upper extremities Discussed appropriate use of NSAIDs She does not want any other pain meds  Imaging Review Dg Cervical Spine Complete  04/01/2014   CLINICAL DATA:  Fall, generalized neck pain.  EXAM: CERVICAL SPINE  4+ VIEWS  COMPARISON:  None.  FINDINGS: There is no evidence of cervical spine fracture or prevertebral soft tissue swelling. Alignment is normal. No other significant bone abnormalities are identified.  IMPRESSION: Negative cervical spine radiographs.    Electronically Signed   By: Burman NievesWilliam  Stevens M.D.   On: 04/01/2014 21:06      MDM   Final diagnoses:  Cervical strain    Nursing notes including past medical history and social history reviewed and considered in documentation xrays reviewed and considered     Joya Gaskinsonald W Anamaria Dusenbury, MD 04/01/14 2208

## 2014-04-01 NOTE — Pre-Procedure Instructions (Signed)
Melissa BogusJennifer H Sawyer  04/01/2014   Your procedure is scheduled on:  04-04-2014   Monday   [facility] at 8:00 AM.   Call this number if you have problems the morning of surgery: [phone]   Remember:   Do not eat food or drink liquids after midnight.    Take these medicines the morning of surgery with A SIP OF WATER: zyrtec   Do not wear jewelry, make-up or nail polish.  Do not wear lotions, powders, or perfumes.   Do not shave 48 hours prior to surgery.   Do not bring valuables to the hospital.  [facility] is not responsible for any belongings or valuables.               Contacts, dentures or bridgework may not be worn into surgery.                   Patients discharged the day of surgery will not be allowed to drive home.   Name and phone number of your driver:    Special Instructions: See instruction sheet for pre-op showers with CHG   Please read over the following fact sheets that you were given: Pain Booklet, Coughing and Deep Breathing and Surgical Site Infection Prevention

## 2014-04-01 NOTE — ED Notes (Signed)
Bed: WA02 Expected date:  Expected time:  Means of arrival:  Comments: EMS 32yo F, fall, immobilized

## 2014-04-01 NOTE — ED Notes (Signed)
Pt was home getting ready to go out tonight.  She had previously hurt her LT ankle 2 months ago. Tonight she was rushing around the house getting ready and fell hitting her bed onto her RT shoulder. Denies LOC.  C/O RT neck pain.  Arrived in LSB/C-collar.  CMS wnl, PERRLA.  VS by EMS: 110/68, 80, 16.  CBG 84 (states hx of hypoglycemia)

## 2014-04-01 NOTE — Discharge Instructions (Signed)
You have neck pain, possibly from a cervical strain and/or pinched nerve.  ° °SEEK IMMEDIATE MEDICAL ATTENTION IF: °You develop difficulties swallowing or breathing.  °You have new or worse numbness, weakness, tingling, or movement problems in your arms or legs.  °You develop increasing pain which is uncontrolled with medications.  °You have change in bowel or bladder function, or other concerns. ° ° ° °

## 2014-04-03 MED ORDER — CEFAZOLIN SODIUM-DEXTROSE 2-3 GM-% IV SOLR
2.0000 g | INTRAVENOUS | Status: AC
  Administered 2014-04-04: 2 g via INTRAVENOUS
  Filled 2014-04-03: qty 50

## 2014-04-04 ENCOUNTER — Encounter (HOSPITAL_COMMUNITY)
Admission: RE | Disposition: A | Discharge status: Home / Self Care | Payer: Self-pay | Source: Ambulatory Visit | Attending: Surgery

## 2014-04-04 ENCOUNTER — Encounter (HOSPITAL_COMMUNITY): Payer: Self-pay | Admitting: Anesthesiology

## 2014-04-04 ENCOUNTER — Ambulatory Visit (HOSPITAL_COMMUNITY): Payer: Managed Care, Other (non HMO)

## 2014-04-04 ENCOUNTER — Ambulatory Visit (HOSPITAL_COMMUNITY): Payer: Managed Care, Other (non HMO) | Admitting: Anesthesiology

## 2014-04-04 ENCOUNTER — Encounter (HOSPITAL_COMMUNITY): Payer: Managed Care, Other (non HMO) | Admitting: Anesthesiology

## 2014-04-04 ENCOUNTER — Ambulatory Visit (HOSPITAL_COMMUNITY)
Admission: RE | Admit: 2014-04-04 | Discharge: 2014-04-04 | Disposition: A | Discharge status: Home / Self Care | Payer: Managed Care, Other (non HMO) | Source: Ambulatory Visit | Attending: Surgery | Admitting: Surgery

## 2014-04-04 DIAGNOSIS — K8010 Calculus of gallbladder with chronic cholecystitis without obstruction: Secondary | ICD-10-CM | POA: Insufficient documentation

## 2014-04-04 DIAGNOSIS — E049 Nontoxic goiter, unspecified: Secondary | ICD-10-CM | POA: Insufficient documentation

## 2014-04-04 DIAGNOSIS — Z01812 Encounter for preprocedural laboratory examination: Secondary | ICD-10-CM | POA: Insufficient documentation

## 2014-04-04 DIAGNOSIS — L658 Other specified nonscarring hair loss: Secondary | ICD-10-CM | POA: Insufficient documentation

## 2014-04-04 DIAGNOSIS — F411 Generalized anxiety disorder: Secondary | ICD-10-CM | POA: Insufficient documentation

## 2014-04-04 HISTORY — PX: CHOLECYSTECTOMY: SHX55

## 2014-04-04 LAB — GLUCOSE, CAPILLARY
GLUCOSE-CAPILLARY: 82 mg/dL (ref 70–99)
Glucose-Capillary: 73 mg/dL (ref 70–99)
Glucose-Capillary: 70 mg/dL (ref 70–99)
Glucose-Capillary: 93 mg/dL (ref 70–99)

## 2014-04-04 SURGERY — LAPAROSCOPIC CHOLECYSTECTOMY WITH INTRAOPERATIVE CHOLANGIOGRAM
Anesthesia: General | Site: Abdomen

## 2014-04-04 MED ORDER — LACTATED RINGERS IV SOLN
INTRAVENOUS | Status: DC
  Administered 2014-04-04 (×2): via INTRAVENOUS

## 2014-04-04 MED ORDER — LIDOCAINE HCL (CARDIAC) 20 MG/ML IV SOLN
INTRAVENOUS | Status: DC | PRN
  Administered 2014-04-04: 40 mg via INTRAVENOUS

## 2014-04-04 MED ORDER — OXYCODONE-ACETAMINOPHEN 5-325 MG PO TABS: 1.0000 | ORAL_TABLET | ORAL | Status: DC | PRN

## 2014-04-04 MED ORDER — NEOSTIGMINE METHYLSULFATE 1 MG/ML IJ SOLN
INTRAMUSCULAR | Status: DC | PRN
  Administered 2014-04-04: 3 mg via INTRAVENOUS

## 2014-04-04 MED ORDER — SODIUM CHLORIDE 0.9 % IV SOLN
INTRAVENOUS | Status: DC | PRN
  Administered 2014-04-04: 13:00:00

## 2014-04-04 MED ORDER — ONDANSETRON HCL 4 MG/2ML IJ SOLN
INTRAMUSCULAR | Status: DC | PRN
  Administered 2014-04-04: 4 mg via INTRAVENOUS

## 2014-04-04 MED ORDER — OXYCODONE HCL 5 MG/5ML PO SOLN: 5.0000 mg | Freq: Once | ORAL | Status: AC | PRN

## 2014-04-04 MED ORDER — SCOPOLAMINE 1 MG/3DAYS TD PT72
MEDICATED_PATCH | TRANSDERMAL | Status: AC
  Filled 2014-04-04: qty 1

## 2014-04-04 MED ORDER — ONDANSETRON HCL 4 MG/2ML IJ SOLN
INTRAMUSCULAR | Status: AC
  Filled 2014-04-04: qty 2

## 2014-04-04 MED ORDER — PROPOFOL 10 MG/ML IV BOLUS
INTRAVENOUS | Status: DC | PRN
  Administered 2014-04-04: 140 mg via INTRAVENOUS

## 2014-04-04 MED ORDER — KETOROLAC TROMETHAMINE 30 MG/ML IJ SOLN: 15.0000 mg | Freq: Once | INTRAMUSCULAR | Status: DC | PRN

## 2014-04-04 MED ORDER — ACETAMINOPHEN 325 MG PO TABS: 325.0000 mg | ORAL_TABLET | ORAL | Status: DC | PRN

## 2014-04-04 MED ORDER — ROCURONIUM BROMIDE 100 MG/10ML IV SOLN
INTRAVENOUS | Status: DC | PRN
  Administered 2014-04-04: 40 mg via INTRAVENOUS

## 2014-04-04 MED ORDER — OXYCODONE HCL 5 MG PO TABS
5.0000 mg | ORAL_TABLET | Freq: Once | ORAL | Status: AC | PRN
  Administered 2014-04-04: 5 mg via ORAL
  Filled 2014-04-04 (×2): qty 1

## 2014-04-04 MED ORDER — ROCURONIUM BROMIDE 50 MG/5ML IV SOLN
INTRAVENOUS | Status: AC
  Filled 2014-04-04: qty 1

## 2014-04-04 MED ORDER — ONDANSETRON HCL 4 MG/2ML IJ SOLN
4.0000 mg | Freq: Once | INTRAMUSCULAR | Status: AC | PRN
  Administered 2014-04-04: 4 mg via INTRAVENOUS

## 2014-04-04 MED ORDER — ACETAMINOPHEN 160 MG/5ML PO SOLN
325.0000 mg | ORAL | Status: DC | PRN
Start: 2014-04-04 — End: 2014-04-04
  Filled 2014-04-04: qty 20.3

## 2014-04-04 MED ORDER — 0.9 % SODIUM CHLORIDE (POUR BTL) OPTIME
TOPICAL | Status: DC | PRN
  Administered 2014-04-04: 1000 mL

## 2014-04-04 MED ORDER — FENTANYL CITRATE 0.05 MG/ML IJ SOLN
INTRAMUSCULAR | Status: AC
  Filled 2014-04-04: qty 5

## 2014-04-04 MED ORDER — MIDAZOLAM HCL 2 MG/2ML IJ SOLN
INTRAMUSCULAR | Status: AC
  Filled 2014-04-04: qty 2

## 2014-04-04 MED ORDER — PROMETHAZINE HCL 25 MG/ML IJ SOLN
6.2500 mg | INTRAMUSCULAR | Status: DC | PRN
  Administered 2014-04-04: 6.25 mg via INTRAVENOUS
  Filled 2014-04-04: qty 1

## 2014-04-04 MED ORDER — HYDROMORPHONE HCL PF 1 MG/ML IJ SOLN
INTRAMUSCULAR | Status: AC
  Filled 2014-04-04: qty 1

## 2014-04-04 MED ORDER — BUPIVACAINE-EPINEPHRINE (PF) 0.25% -1:200000 IJ SOLN
INTRAMUSCULAR | Status: AC
  Filled 2014-04-04: qty 30

## 2014-04-04 MED ORDER — CHLORHEXIDINE GLUCONATE 4 % EX LIQD: 1.0000 "application " | Freq: Once | CUTANEOUS | Status: DC

## 2014-04-04 MED ORDER — BUPIVACAINE-EPINEPHRINE 0.25% -1:200000 IJ SOLN
INTRAMUSCULAR | Status: DC | PRN
Start: 2014-04-04 — End: 2014-04-04
  Administered 2014-04-04: 10 mL

## 2014-04-04 MED ORDER — FENTANYL CITRATE 0.05 MG/ML IJ SOLN
INTRAMUSCULAR | Status: DC | PRN
  Administered 2014-04-04: 50 ug via INTRAVENOUS
  Administered 2014-04-04: 150 ug via INTRAVENOUS
  Administered 2014-04-04: 50 ug via INTRAVENOUS

## 2014-04-04 MED ORDER — DEXAMETHASONE SODIUM PHOSPHATE 4 MG/ML IJ SOLN
INTRAMUSCULAR | Status: DC | PRN
  Administered 2014-04-04: 4 mg via INTRAVENOUS

## 2014-04-04 MED ORDER — ONDANSETRON 4 MG PO TBDP: 4.0000 mg | ORAL_TABLET | Freq: Three times a day (TID) | ORAL | Status: DC | PRN

## 2014-04-04 MED ORDER — DEXAMETHASONE SODIUM PHOSPHATE 4 MG/ML IJ SOLN
INTRAMUSCULAR | Status: AC
  Filled 2014-04-04: qty 1

## 2014-04-04 MED ORDER — SCOPOLAMINE 1 MG/3DAYS TD PT72
MEDICATED_PATCH | TRANSDERMAL | Status: DC | PRN
  Administered 2014-04-04: 1 via TRANSDERMAL

## 2014-04-04 MED ORDER — HYDROMORPHONE HCL PF 1 MG/ML IJ SOLN
0.2500 mg | INTRAMUSCULAR | Status: DC | PRN
  Administered 2014-04-04: 0.25 mg via INTRAVENOUS

## 2014-04-04 MED ORDER — MIDAZOLAM HCL 5 MG/5ML IJ SOLN
INTRAMUSCULAR | Status: DC | PRN
  Administered 2014-04-04: 2 mg via INTRAVENOUS

## 2014-04-04 MED ORDER — GLYCOPYRROLATE 0.2 MG/ML IJ SOLN
INTRAMUSCULAR | Status: DC | PRN
  Administered 2014-04-04: 0.4 mg via INTRAVENOUS

## 2014-04-04 MED ORDER — HYDROMORPHONE HCL PF 1 MG/ML IJ SOLN
0.2500 mg | INTRAMUSCULAR | Status: DC | PRN
Start: 2014-04-04 — End: 2014-04-04
  Administered 2014-04-04 (×2): 0.25 mg via INTRAVENOUS

## 2014-04-04 MED ORDER — SODIUM CHLORIDE 0.9 % IR SOLN
Status: DC | PRN
  Administered 2014-04-04: 1000 mL

## 2014-04-04 SURGICAL SUPPLY — 43 items
APPLIER CLIP ROT 10 11.4 M/L (STAPLE) ×3
BENZOIN TINCTURE PRP APPL 2/3 (GAUZE/BANDAGES/DRESSINGS) ×3 IMPLANT
BLADE SURG ROTATE 9660 (MISCELLANEOUS) IMPLANT
CANISTER SUCTION 2500CC (MISCELLANEOUS) ×3 IMPLANT
CHLORAPREP W/TINT 26ML (MISCELLANEOUS) ×3 IMPLANT
CLIP APPLIE ROT 10 11.4 M/L (STAPLE) ×1 IMPLANT
CLOSURE WOUND 1/2 X4 (GAUZE/BANDAGES/DRESSINGS) ×1
COVER MAYO STAND STRL (DRAPES) ×3 IMPLANT
COVER SURGICAL LIGHT HANDLE (MISCELLANEOUS) ×3 IMPLANT
DRAPE C-ARM 42X72 X-RAY (DRAPES) ×3 IMPLANT
DRAPE UTILITY 15X26 W/TAPE STR (DRAPE) ×6 IMPLANT
DRSG TEGADERM 2-3/8X2-3/4 SM (GAUZE/BANDAGES/DRESSINGS) ×9 IMPLANT
DRSG TEGADERM 4X4.75 (GAUZE/BANDAGES/DRESSINGS) ×3 IMPLANT
ELECT REM PT RETURN 9FT ADLT (ELECTROSURGICAL) ×3
ELECTRODE REM PT RTRN 9FT ADLT (ELECTROSURGICAL) ×1 IMPLANT
FILTER SMOKE EVAC LAPAROSHD (FILTER) ×3 IMPLANT
GAUZE SPONGE 2X2 8PLY STRL LF (GAUZE/BANDAGES/DRESSINGS) ×1 IMPLANT
GLOVE BIO SURGEON STRL SZ7 (GLOVE) ×6 IMPLANT
GLOVE BIO SURGEON STRL SZ7.5 (GLOVE) ×3 IMPLANT
GLOVE BIOGEL PI IND STRL 7.5 (GLOVE) ×2 IMPLANT
GLOVE BIOGEL PI INDICATOR 7.5 (GLOVE) ×4
GOWN STRL REUS W/ TWL LRG LVL3 (GOWN DISPOSABLE) ×3 IMPLANT
GOWN STRL REUS W/TWL LRG LVL3 (GOWN DISPOSABLE) ×6
KIT BASIN OR (CUSTOM PROCEDURE TRAY) ×3 IMPLANT
KIT ROOM TURNOVER OR (KITS) ×3 IMPLANT
NS IRRIG 1000ML POUR BTL (IV SOLUTION) ×3 IMPLANT
PAD ARMBOARD 7.5X6 YLW CONV (MISCELLANEOUS) ×3 IMPLANT
POUCH SPECIMEN RETRIEVAL 10MM (ENDOMECHANICALS) ×3 IMPLANT
SCISSORS LAP 5X35 DISP (ENDOMECHANICALS) ×3 IMPLANT
SET CHOLANGIOGRAPH 5 50 .035 (SET/KITS/TRAYS/PACK) ×3 IMPLANT
SET IRRIG TUBING LAPAROSCOPIC (IRRIGATION / IRRIGATOR) ×3 IMPLANT
SLEEVE ENDOPATH XCEL 5M (ENDOMECHANICALS) ×3 IMPLANT
SPECIMEN JAR SMALL (MISCELLANEOUS) ×3 IMPLANT
SPONGE GAUZE 2X2 STER 10/PKG (GAUZE/BANDAGES/DRESSINGS) ×2
STRIP CLOSURE SKIN 1/2X4 (GAUZE/BANDAGES/DRESSINGS) ×2 IMPLANT
SUT MNCRL AB 4-0 PS2 18 (SUTURE) ×3 IMPLANT
TOWEL OR 17X24 6PK STRL BLUE (TOWEL DISPOSABLE) ×3 IMPLANT
TOWEL OR 17X26 10 PK STRL BLUE (TOWEL DISPOSABLE) ×3 IMPLANT
TOWEL OR NON WOVEN STRL DISP B (DISPOSABLE) ×3 IMPLANT
TRAY LAPAROSCOPIC (CUSTOM PROCEDURE TRAY) ×3 IMPLANT
TROCAR XCEL BLUNT TIP 100MML (ENDOMECHANICALS) ×3 IMPLANT
TROCAR XCEL NON-BLD 11X100MML (ENDOMECHANICALS) ×3 IMPLANT
TROCAR XCEL NON-BLD 5MMX100MML (ENDOMECHANICALS) ×3 IMPLANT

## 2014-04-04 NOTE — H&P (View-Only) (Signed)
Patient ID: Melissa KandJennifer Sawyer, female   DOB: 10/25/1981, 33 y.o.   MRN: [id number]  Chief Complaint  Patient presents with  . New Evaluation    gallstones    HPI Melissa Sawyer is a 33 y.o. female.  Referred by Elsie StainEleanore Egan, PA-C for evaluation of gallbladder disease HPI This is a healthy 33 year old female who presents with a recent severe attack of epigastric abdominal pain that spread around her and her abdomen to her back. This was associated with diarrhea and nausea as well as significant bloating. The pain occurred after eating hot dogs as well as macaroni and cheese. The episode lasted about 20 minutes. The patient has had long history of postprandial diarrhea. Over the last year or so she has developed more postprandial abdominal bloating and increased flatulence. She was evaluated at urgent care and was referred for labs and an ultrasound. This confirmed gallstones but no sign of choledocholithiasis. She presents now for surgical evaluation. She continues to have intermittent mild symptoms. Past Medical History  Diagnosis Date  . Enlarged thyroid     sees dr Romero Bellingbalin  . Alopecia   . Anxiety     Past Surgical History  Procedure Laterality Date  . Tonsillectomy    . Cystectomy  age 33    left hand  . Finger nail surgery      removed thumb nail    Family History  Problem Relation Age of Onset  . Thyroid disease Mother   . Cancer Mother     thyroid  . Mitral valve prolapse Mother     Social History History  Substance Use Topics  . Smoking status: Never Smoker   . Smokeless tobacco: Not on file  . Alcohol Use: 1.5 oz/week    3 drink(s) per week    Allergies  Allergen Reactions  . Dicloxacillin Rash    Current Outpatient Prescriptions  Medication Sig Dispense Refill  . BIOTIN PO Take by mouth daily.      . IBUPROFEN PO Take by mouth as needed.      . Norethin Ace-Eth Estrad-FE (MINASTRIN 24 FE) 1-20 MG-MCG(24) CHEW Chew 1 tablet by mouth daily.  28 tablet  8   No  current facility-administered medications for this visit.    Review of Systems Review of Systems  Constitutional: Negative for fever, chills and unexpected weight change.  HENT: Negative for congestion, hearing loss, sore throat, trouble swallowing and voice change.   Eyes: Negative for visual disturbance.  Respiratory: Negative for cough and wheezing.   Cardiovascular: Negative for chest pain, palpitations and leg swelling.  Gastrointestinal: Positive for nausea, abdominal pain, diarrhea and abdominal distention. Negative for vomiting, constipation, blood in stool and anal bleeding.  Genitourinary: Negative for hematuria, vaginal bleeding and difficulty urinating.  Musculoskeletal: Negative for arthralgias.  Skin: Negative for rash and wound.  Neurological: Negative for seizures, syncope and headaches.  Hematological: Negative for adenopathy. Does not bruise/bleed easily.  Psychiatric/Behavioral: Negative for confusion.    Blood pressure 108/68, pulse 64, temperature 98.2 F (36.8 C), resp. rate 12, height 5\' 10"  (1.778 m), weight 144 lb 9.6 oz (65.59 kg), last menstrual period 02/07/2014.  Physical Exam Physical Exam WDWN in NAD HEENT:  EOMI, sclera anicteric Neck:  No masses, no thyromegaly Lungs:  CTA bilaterally; normal respiratory effort CV:  Regular rate and rhythm; no murmurs Abd:  +bowel sounds, soft, non-tender, no masses Ext:  Well-perfused; no edema Skin:  Warm, dry; no sign of jaundice  Data Reviewed Lab Results  Component Value Date   WBC 4.4* 03/07/2014   HGB 12.7 03/07/2014   HCT 39.7 03/07/2014   MCV 91.6 03/07/2014   Lab Results  Component Value Date   CREATININE 0.58 03/07/2014   BUN 7 03/07/2014   NA 138 03/07/2014   K 3.8 03/07/2014   CL 100 03/07/2014   CO2 31 03/07/2014   Lab Results  Component Value Date   ALT 20 03/07/2014   AST 19 03/07/2014   ALKPHOS 43 03/07/2014   BILITOT 0.3 03/07/2014   Us Abdomen Limited Ruq  03/10/2014   CLINICAL DATA:   Severe right upper quadrant pain  EXAM: US ABDOMEN LIMITED - RIGHT UPPER QUADRANT  COMPARISON:  None.  FINDINGS: Gallbladder:  Two dominant gallstones measuring up to 2.3 cm. Additional layering debris/smaller stones. No gallbladder wall thickening or pericholecystic fluid. Negative sonographic Murphy's sign.  Common bile duct:  Diameter: 3 mm.  Liver:  No focal lesion identified. Within normal limits in parenchymal echogenicity.  Additional comments: Incidentally noted is suspected caliectasis in the right upper kidney.  IMPRESSION: Cholelithiasis, without associated sonographic findings to suggest acute cholecystitis.   Electronically Signed   By: Sriyesh  Krishnan M.D.   On: 03/10/2014 08:29      Assessment    Chronic calculus cholecystitis     Plan    Laparoscopic cholecystectomy with intraoperative cholangiogram. The surgical procedure has been discussed with the patient.  Potential risks, benefits, alternative treatments, and expected outcomes have been explained.  All of the patient's questions at this time have been answered.  The likelihood of reaching the patient's treatment goal is good.  The patient understand the proposed surgical procedure and wishes to proceed.         Ajai Terhaar K. 03/18/2014, 11:55 AM    

## 2014-04-04 NOTE — Anesthesia Postprocedure Evaluation (Signed)
  Anesthesia Post-op Note  Patient: Melissa BogusJennifer H Sawyer  Procedure(s) Performed: Procedure(s): LAPAROSCOPIC CHOLECYSTECTOMY WITH INTRAOPERATIVE CHOLANGIOGRAM (N/A)  Patient Location: PACU  Anesthesia Type:General  Level of Consciousness: awake and alert   Airway and Oxygen Therapy: Patient Spontanous Breathing  Post-op Pain: mild  Post-op Assessment: Post-op Vital signs reviewed, Patient's Cardiovascular Status Stable, Respiratory Function Stable, Patent Airway, No signs of Nausea or vomiting and Pain level controlled  Post-op Vital Signs: Reviewed and stable  Last Vitals:  Filed Vitals:   04/04/14 1539  BP:   Pulse:   Temp: 36.2 C  Resp:     Complications: No apparent anesthesia complications

## 2014-04-04 NOTE — Progress Notes (Signed)
Pt c/o mild itching. No apparent skin irritation noted. Offered to get order for Benadryl but again pt refuses med because she is afraid it will make her more sleepy and wants to go home

## 2014-04-04 NOTE — Interval H&P Note (Signed)
History and Physical Interval Note:  04/04/2014 8:05 AM  Melissa Sawyer  has presented today for surgery, with the diagnosis of Chronic calculus cholecystitis  The various methods of treatment have been discussed with the patient and family. After consideration of risks, benefits and other options for treatment, the patient has consented to  Procedure(s): LAPAROSCOPIC CHOLECYSTECTOMY WITH INTRAOPERATIVE CHOLANGIOGRAM (N/A) as a surgical intervention .  The patient's history has been reviewed, patient examined, no change in status, stable for surgery.  I have reviewed the patient's chart and labs.  Questions were answered to the patient's satisfaction.     Wilmon ArmsMatthew K. Terrell Ostrand

## 2014-04-04 NOTE — Op Note (Signed)
Laparoscopic Cholecystectomy with IOC Procedure Note  Indications: This patient presents with symptomatic gallbladder disease and will undergo laparoscopic cholecystectomy.  Pre-operative Diagnosis: Calculus of gallbladder with other cholecystitis, without mention of obstruction  Post-operative Diagnosis: Same  Surgeon: Wilmon ArmsMatthew K. Elsey Holts   Assistants: None  Anesthesia: General endotracheal anesthesia  ASA Class: 1  Procedure Details  The patient was seen again in the Holding Room. The risks, benefits, complications, treatment options, and expected outcomes were discussed with the patient. The possibilities of reaction to medication, pulmonary aspiration, perforation of viscus, bleeding, recurrent infection, finding a normal gallbladder, the need for additional procedures, failure to diagnose a condition, the possible need to convert to an open procedure, and creating a complication requiring transfusion or operation were discussed with the patient. The likelihood of improving the patient's symptoms with return to their baseline status is good.  The patient and/or family concurred with the proposed plan, giving informed consent. The site of surgery properly noted. The patient was taken to Operating Room, identified as Arman BogusJennifer H Marlatt and the procedure verified as Laparoscopic Cholecystectomy with Intraoperative Cholangiogram. A Time Out was held and the above information confirmed.  Prior to the induction of general anesthesia, antibiotic prophylaxis was administered. General endotracheal anesthesia was then administered and tolerated well. After the induction, the abdomen was prepped with Chloraprep and draped in the sterile fashion. The patient was positioned in the supine position.  Local anesthetic agent was injected into the skin near the umbilicus and an incision made. We dissected down to the abdominal fascia with blunt dissection.  The fascia was incised vertically and we entered the  peritoneal cavity bluntly.  A pursestring suture of 0-Vicryl was placed around the fascial opening.  The Hasson cannula was inserted and secured with the stay suture.  Pneumoperitoneum was then created with CO2 and tolerated well without any adverse changes in the patient's vital signs. An 11-mm port was placed in the subxiphoid position.  Two 5-mm ports were placed in the right upper quadrant. All skin incisions were infiltrated with a local anesthetic agent before making the incision and placing the trocars.   We positioned the patient in reverse Trendelenburg, tilted slightly to the patient's left.  The gallbladder was identified, the fundus grasped and retracted cephalad. Adhesions were lysed bluntly and with the electrocautery where indicated, taking care not to injure any adjacent organs or viscus. The infundibulum was grasped and retracted laterally, exposing the peritoneum overlying the triangle of Calot. This was then divided and exposed in a blunt fashion. A critical view of the cystic duct and cystic artery was obtained.  The cystic duct was clearly identified and bluntly dissected circumferentially. The cystic duct was ligated with a clip distally.   An incision was made in the cystic duct and the [facility] cholangiogram catheter introduced. The catheter was secured using a clip. A cholangiogram was then obtained which showed good visualization of the distal and proximal biliary tree with no sign of filling defects or obstruction.  Initially, contrast did not flow into the duodenum.  There was no obvious sign of filling defect.  We waited a few minutes and then repeated the cholangiogram.  Contrast flowed easily into the duodenum. There may have been some spasm at the sphincter of Oddi or some sludge in the common bile duct.  The catheter was then removed.   The cystic duct was then ligated with clips and divided. The cystic artery was identified, dissected free, ligated with clips and divided as well.  The gallbladder was dissected from the liver bed in retrograde fashion with the electrocautery. The gallbladder was removed and placed in an Endocatch sac. The liver bed was irrigated and inspected. Hemostasis was achieved with the electrocautery. Copious irrigation was utilized and was repeatedly aspirated until clear.  The gallbladder and Endocatch sac were then removed through the umbilical port site.  The pursestring suture was used to close the umbilical fascia.    We again inspected the right upper quadrant for hemostasis.  Pneumoperitoneum was released as we removed the trocars.  4-0 Monocryl was used to close the skin.   Benzoin, steri-strips, and clean dressings were applied. The patient was then extubated and brought to the recovery room in stable condition. Instrument, sponge, and needle counts were correct at closure and at the conclusion of the case.   Findings: Cholecystitis with Cholelithiasis  Estimated Blood Loss: less than 50 mL         Drains: none         Specimens: Gallbladder           Complications: None; patient tolerated the procedure well.         Disposition: PACU - hemodynamically stable.         Condition: stable  Wilmon ArmsMatthew K. Corliss Skainssuei, MD, [facility]  General/ Trauma Surgery  04/04/2014 1:49 PM

## 2014-04-04 NOTE — Discharge Instructions (Signed)
[facility] °LAPAROSCOPIC SURGERY: POST OP INSTRUCTIONS °Always review your discharge instruction sheet given to you by the facility where your surgery was performed. °IF YOU HAVE DISABILITY OR FAMILY LEAVE FORMS, YOU MUST BRING THEM TO THE OFFICE FOR PROCESSING.   °DO NOT GIVE THEM TO YOUR DOCTOR. ° °1. A prescription for pain medication will be given to you upon discharge.  Take your pain medication as prescribed, if needed.  If narcotic pain medicine is not needed, then you may take acetaminophen (Tylenol) or ibuprofen (Advil) as needed. °2. Take your usually prescribed medications unless otherwise directed. °3. If you need a refill on your pain medication, please contact your pharmacy.  They will contact our office to request authorization. Prescriptions will not be filled after 5pm or on week-ends. °4. You should follow a light diet the first few days after arrival home, such as soup and crackers, etc.  Be sure to include lots of fluids daily. °5. Most patients will experience some swelling and bruising in the area of the incisions.  Ice packs will help.  Swelling and bruising can take several days to resolve.  °6. It is common to experience some constipation if taking pain medication after surgery.  Increasing fluid intake and taking a stool softener (such as Colace) will usually help or prevent this problem from occurring.  A mild laxative (Milk of Magnesia or Miralax) should be taken according to package instructions if there are no bowel movements after 48 hours. °7. Unless discharge instructions indicate otherwise, you may remove your bandages 48 hours after surgery, and you may shower at that time.  You will have steri-strips (small skin tapes) in place directly over the incision.  These strips should be left on the skin for 7-10 days.  If your surgeon used skin glue on the incision, you may shower in 24 hours.  The glue will flake off over the next 2-3 weeks.  Any sutures or staples  will be removed at the office during your follow-up visit. °8. ACTIVITIES:  You may resume regular (light) daily activities beginning the next day--such as daily self-care, walking, climbing stairs--gradually increasing activities as tolerated.  You may have sexual intercourse when it is comfortable.  Refrain from any heavy lifting or straining until approved by your doctor. °a. You may drive when you are no longer taking prescription pain medication, you can comfortably wear a seatbelt, and you can safely maneuver your car and apply brakes. °b. RETURN TO WORK:   2-3 weeks °9. You should see your doctor in the office for a follow-up appointment approximately 2-3 weeks after your surgery.  Make sure that you call for this appointment within a day or two after you arrive home to insure a convenient appointment time. °10. OTHER INSTRUCTIONS: ________________________________________________________________________ °WHEN TO CALL YOUR DOCTOR: °1. Fever over 101.0 °2. Inability to urinate °3. Continued bleeding from incision. °4. Increased pain, redness, or drainage from the incision. °5. Increasing abdominal pain ° °The clinic staff is available to answer your questions during regular business hours.  Please don’t hesitate to call and ask to speak to one of the nurses for clinical concerns.  If you have a medical emergency, go to the nearest emergency room or call 911.  A surgeon from [facility] is always on call at the hospital. °[address], [address], [place], [place]  [address] ? [address] [address], [place], [place]   [address] °[phone] ? [phone] ? FAX [phone] °Web site:   [email or web address] ° ° °What to eat: ° °For your first meals, you should eat lightly; only small meals initially.  If you do not have nausea, you may eat larger meals.  Avoid spicy, greasy and heavy food.   ° °General Anesthesia, Adult, Care After  °Refer to this sheet in the next few weeks. These  instructions provide you with information on caring for yourself after your procedure. Your health care provider may also give you more specific instructions. Your treatment has been planned according to current medical practices, but problems sometimes occur. Call your health care provider if you have any problems or questions after your procedure.  °WHAT TO EXPECT AFTER THE PROCEDURE  °After the procedure, it is typical to experience:  °Sleepiness.  °Nausea and vomiting. °HOME CARE INSTRUCTIONS  °For the first 24 hours after general anesthesia:  °Have a responsible person with you.  °Do not drive a car. If you are alone, do not take public transportation.  °Do not drink alcohol.  °Do not take medicine that has not been prescribed by your health care provider.  °Do not sign important papers or make important decisions.  °You may resume a normal diet and activities as directed by your health care provider.  °Change bandages (dressings) as directed.  °If you have questions or problems that seem related to general anesthesia, call the hospital and ask for the anesthetist or anesthesiologist on call. °SEEK MEDICAL CARE IF:  °You have nausea and vomiting that continue the day after anesthesia.  °You develop a rash. °SEEK IMMEDIATE MEDICAL CARE IF:  °You have difficulty breathing.  °You have chest pain.  °You have any allergic problems. °Document Released: 03/17/2001 Document Revised: 08/11/2013 Document Reviewed: 06/24/2013  °ExitCare® Patient Information ©2014 [employer].  ° ° °

## 2014-04-04 NOTE — Progress Notes (Signed)
Pt states still feeling a little nausea but becomes nauseated often at home. Pt does not want Phenergan--afraid it will make her more sleepy and she wants to go home.

## 2014-04-04 NOTE — Transfer of Care (Signed)
Immediate Anesthesia Transfer of Care Note  Patient: Melissa BogusJennifer H Sawyer  Procedure(s) Performed: Procedure(s): LAPAROSCOPIC CHOLECYSTECTOMY WITH INTRAOPERATIVE CHOLANGIOGRAM (N/A)  Patient Location: PACU  Anesthesia Type:General  Level of Consciousness: awake, alert  and oriented  Airway & Oxygen Therapy: Patient Spontanous Breathing and Patient connected to nasal cannula oxygen  Post-op Assessment: Report given to PACU RN, Post -op Vital signs reviewed and stable and Patient moving all extremities  Post vital signs: Reviewed and stable  Complications: No apparent anesthesia complications

## 2014-04-04 NOTE — Anesthesia Procedure Notes (Signed)
Procedure Name: Intubation Date/Time: 04/04/2014 12:48 PM Performed by: Sharlene DoryWALKER, Maxson Oddo E Pre-anesthesia Checklist: Patient identified, Emergency Drugs available, Suction available, Patient being monitored and Timeout performed Patient Re-evaluated:Patient Re-evaluated prior to inductionOxygen Delivery Method: Circle system utilized Preoxygenation: Pre-oxygenation with 100% oxygen Intubation Type: IV induction Ventilation: Mask ventilation without difficulty Laryngoscope Size: Mac and 3 Grade View: Grade I Tube type: Oral Tube size: 7.0 mm Number of attempts: 1 Airway Equipment and Method: Stylet Placement Confirmation: ETT inserted through vocal cords under direct vision,  positive ETCO2 and breath sounds checked- equal and bilateral Secured at: 21 cm Tube secured with: Tape

## 2014-04-04 NOTE — Progress Notes (Signed)
Received report from Solectron CorporationJamie RN as primary

## 2014-04-04 NOTE — Anesthesia Preprocedure Evaluation (Addendum)
Anesthesia Evaluation  Patient identified by MRN, date of birth, ID band Patient awake    Reviewed: Allergy & Precautions, H&P , NPO status , Patient's Chart, lab work & pertinent test results  History of Anesthesia Complications (+) PONV and history of anesthetic complications  Airway Mallampati: I      Dental  (+) Teeth Intact, Chipped,    Pulmonary neg pulmonary ROS,  breath sounds clear to auscultation        Cardiovascular negative cardio ROS  Rhythm:Regular     Neuro/Psych Anxiety negative neurological ROS     GI/Hepatic Neg liver ROS, Symptomatic gall stones   Endo/Other  negative endocrine ROS  Renal/GU negative Renal ROS     Musculoskeletal negative musculoskeletal ROS (+)   Abdominal   Peds  Hematology negative hematology ROS (+)   Anesthesia Other Findings   Reproductive/Obstetrics                          Anesthesia Physical Anesthesia Plan  ASA: I  Anesthesia Plan: General   Post-op Pain Management:    Induction: Intravenous  Airway Management Planned: Oral ETT  Additional Equipment:   Intra-op Plan:   Post-operative Plan: Extubation in OR  Informed Consent: I have reviewed the patients History and Physical, chart, labs and discussed the procedure including the risks, benefits and alternatives for the proposed anesthesia with the patient or authorized representative who has indicated his/her understanding and acceptance.   Dental advisory given  Plan Discussed with: CRNA and Surgeon  Anesthesia Plan Comments:        Anesthesia Quick Evaluation

## 2014-04-05 ENCOUNTER — Other Ambulatory Visit (HOSPITAL_COMMUNITY): Payer: Managed Care, Other (non HMO)

## 2014-04-05 ENCOUNTER — Encounter (HOSPITAL_COMMUNITY): Payer: Self-pay | Admitting: Surgery

## 2014-04-29 ENCOUNTER — Encounter (INDEPENDENT_AMBULATORY_CARE_PROVIDER_SITE_OTHER): Payer: Managed Care, Other (non HMO) | Admitting: Surgery

## 2014-05-02 ENCOUNTER — Encounter (INDEPENDENT_AMBULATORY_CARE_PROVIDER_SITE_OTHER): Payer: Self-pay | Admitting: Surgery

## 2014-05-02 ENCOUNTER — Ambulatory Visit (INDEPENDENT_AMBULATORY_CARE_PROVIDER_SITE_OTHER): Payer: Managed Care, Other (non HMO) | Admitting: Surgery

## 2014-05-02 VITALS — BP 110/66 | HR 60 | Temp 98.8°F | Resp 12 | Ht 70.0 in | Wt 143.2 lb

## 2014-05-02 DIAGNOSIS — K8010 Calculus of gallbladder with chronic cholecystitis without obstruction: Secondary | ICD-10-CM

## 2014-05-02 NOTE — Progress Notes (Signed)
S/p laparoscopic cholecystectomy with intraoperative cholangiogram on 04/04/14 for chronic calculus cholecystitis.  She is having some post-prandial diarrhea.  Otherwise doing well.  Incisions healed with no sign of infection.  No abdominal tenderness.  Fiber supplement daily for a few weeks until diarrhea improves.  Follow-up PRN.  Wilmon ArmsMatthew K. Corliss Skainssuei, MD, [facility]  General/ Trauma Surgery  05/02/2014 10:12 AM

## 2014-05-04 ENCOUNTER — Ambulatory Visit (INDEPENDENT_AMBULATORY_CARE_PROVIDER_SITE_OTHER): Payer: Managed Care, Other (non HMO) | Admitting: Certified Nurse Midwife

## 2014-05-04 ENCOUNTER — Encounter: Payer: Self-pay | Admitting: Certified Nurse Midwife

## 2014-05-04 VITALS — BP 100/62 | HR 64 | Resp 16 | Ht 70.0 in | Wt 144.0 lb

## 2014-05-04 DIAGNOSIS — Z309 Encounter for contraceptive management, unspecified: Secondary | ICD-10-CM

## 2014-05-04 DIAGNOSIS — N921 Excessive and frequent menstruation with irregular cycle: Secondary | ICD-10-CM

## 2014-05-04 MED ORDER — ETONOGESTREL-ETHINYL ESTRADIOL 0.12-0.015 MG/24HR VA RING: 1.0000 | VAGINAL_RING | VAGINAL | Status: DC

## 2014-05-04 NOTE — Patient Instructions (Signed)

## 2014-05-04 NOTE — Progress Notes (Signed)
33 y.o. Married Caucasian [id number] here for evaluation of contraception Minastrin FE initiated on 06/23/2013 for contraception and PMS. Minastrin has been working well for PMS, but is having cycle spotting for 2-3 days. Patient would like to change OCP or another type of contraception. Menses duration 4 days with moderate  flow. Patient taking medication as prescribed. Denies missed pills, headaches, nausea, DVT warning signs or symptoms,  breakthrough  or other changes.   Keeping menses calendar. No other health issues today  O: Healthy female, WD WN Affect: normal orientation X 3  Pelvic exam: external genital area: normal no lesions Vagina: small amount of blood, no lesions Cervix:normal, non tender Uterus: Normal, non tender Adnexa: no masses, normal, non tender   A: History of BTB with Minastrin, but working well for PMS desires change  P:Discussed option of changing pill to increase the dosage to give better endometrial support, or try Nuvaring or Ortho Evra. Patient not interested in IUD or other methods. Discussed risks/benefits of Nuvaring. Patient would like to try. Patient inserted and removed Nuvaring placebo with Provder checking with no problems. Patient on menses today, so will begin Nuvaring tomorrow. Rx Nuvaring see order with instructions and booklet given. Questions addressed. Patient will continue menses calendar and advise at aex if BTB has stopped.    RV prn/aex

## 2014-05-05 ENCOUNTER — Telehealth: Payer: Self-pay | Admitting: Certified Nurse Midwife

## 2014-05-05 DIAGNOSIS — Z309 Encounter for contraceptive management, unspecified: Secondary | ICD-10-CM

## 2014-05-05 MED ORDER — ETONOGESTREL-ETHINYL ESTRADIOL 0.12-0.015 MG/24HR VA RING: 1.0000 | VAGINAL_RING | VAGINAL | Status: DC

## 2014-05-05 NOTE — Progress Notes (Signed)
Reviewed personally.  M. Suzanne Haidyn Chadderdon, MD.  

## 2014-05-05 NOTE — Telephone Encounter (Signed)
[facility] Patient was told their electronic prescription service was down yesterday and they did not get the request yesterday. (Nuvoring)

## 2014-05-05 NOTE — Telephone Encounter (Signed)
Spoke with patient to advise [facility]. Patient [facility]. Would like rx sent to [facility] [address] instead. Advised would send rx over at this time to new location. Patient agreeable.  Routing to provider for final review. Patient agreeable to disposition. Will close encounter

## 2014-05-05 NOTE — Telephone Encounter (Signed)
Patient says that we need to send the rx needs to be send to a different pharmacy because they still have not received it at [facility] because their service is down [facility] on [phone]

## 2014-06-28 ENCOUNTER — Encounter: Payer: Self-pay | Admitting: Certified Nurse Midwife

## 2014-06-28 ENCOUNTER — Ambulatory Visit (INDEPENDENT_AMBULATORY_CARE_PROVIDER_SITE_OTHER): Payer: Managed Care, Other (non HMO) | Admitting: Certified Nurse Midwife

## 2014-06-28 VITALS — BP 96/60 | HR 64 | Resp 16 | Ht 70.0 in | Wt 142.0 lb

## 2014-06-28 DIAGNOSIS — Z309 Encounter for contraceptive management, unspecified: Secondary | ICD-10-CM

## 2014-06-28 DIAGNOSIS — Z01419 Encounter for gynecological examination (general) (routine) without abnormal findings: Secondary | ICD-10-CM

## 2014-06-28 MED ORDER — ETONOGESTREL-ETHINYL ESTRADIOL 0.12-0.015 MG/24HR VA RING
1.0000 | VAGINAL_RING | VAGINAL | Status: DC
Start: 2014-06-28 — End: 2015-01-24

## 2014-06-28 NOTE — Progress Notes (Signed)
33 y.o. [id number] Married Caucasian Fe here for annual exam.Periods normal with Nuvaring. So happy with Nuvaring use!!! No more mood swings and contraception. Fractured  Left ankle in a fall and gallbladder removed in the past year. No problems now, "feeling good and enjoying the summer with family". Sees PCP prn. No health issues today.  Patient's last menstrual period was 06/24/2014.          Sexually active: Yes.    The current method of family planning is NuvaRing vaginal inserts.    Exercising: No.  exercise Smoker:  no  Health Maintenance: Pap: 06-23-13 neg HPV HR neg MMG:  03-13-11 u/s rt breast & aspiration neg Colonoscopy:  none BMD:   none TDaP: 2014 Labs: none Self breast exam: not done   reports that she has never smoked. She does not have any smokeless tobacco history on file. She reports that she drinks about 2 ounces of alcohol per week. She reports that she does not use illicit drugs.  Past Medical History  Diagnosis Date  . Enlarged thyroid     sees dr Romero Bellingbalin  . Alopecia   . Anxiety   . Hypoglycemic reaction     has severe reaction if blood sugar drops  . PONV (postoperative nausea and vomiting)     motion sickness,extreme nausea  . Foot fracture, left     Past Surgical History  Procedure Laterality Date  . Tonsillectomy    . Cystectomy  age 33    left hand  . Finger nail surgery      removed thumb nail  . Cholecystectomy N/A 04/04/2014    Procedure: LAPAROSCOPIC CHOLECYSTECTOMY WITH INTRAOPERATIVE CHOLANGIOGRAM;  Surgeon: Wilmon ArmsMatthew K. Corliss Skainssuei, MD;  Location: [facility] OR;  Service: General;  Laterality: N/A;  . Refractive surgery    . Cholecystectomy      Current Outpatient Prescriptions  Medication Sig Dispense Refill  . cetirizine (ZYRTEC) 10 MG tablet Take 10 mg by mouth as needed.       . etonogestrel-ethinyl estradiol (NUVARING) 0.12-0.015 MG/24HR vaginal ring Place 1 each vaginally every 28 (twenty-eight) days. Insert vaginally and leave in place for 3  consecutive weeks, then remove for 1 week.  3 each  4   No current facility-administered medications for this visit.    Family History  Problem Relation Age of Onset  . Thyroid disease Mother   . Cancer Mother     thyroid  . Mitral valve prolapse Mother     ROS:  Pertinent items are noted in HPI.  Otherwise, a comprehensive ROS was negative.  Exam:   BP 96/60  Pulse 64  Resp 16  Ht 5\' 10"  (1.778 m)  Wt 142 lb (64.411 kg)  BMI 20.37 kg/m2  LMP 06/24/2014 Height: 5\' 10"  (177.8 cm)  Ht Readings from Last 3 Encounters:  06/28/14 5\' 10"  (1.778 m)  05/04/14 5\' 10"  (1.778 m)  05/02/14 5\' 10"  (1.778 m)    General appearance: alert, cooperative and appears stated age Head: Normocephalic, without obvious abnormality, atraumatic Neck: no adenopathy, supple, symmetrical, trachea midline and thyroid normal to inspection and palpation and non-palpable Lungs: clear to auscultation bilaterally Breasts: normal appearance, no masses or tenderness, No nipple retraction or dimpling, No nipple discharge or bleeding, No axillary or supraclavicular adenopathy Heart: regular rate and rhythm Abdomen: soft, non-tender; no masses,  no organomegaly Extremities: extremities normal, atraumatic, no cyanosis or edema Skin: Skin color, texture, turgor normal. No rashes or lesions Lymph nodes: Cervical, supraclavicular, and axillary nodes normal.  No abnormal inguinal nodes palpated Neurologic: Grossly normal   Pelvic: External genitalia:  no lesions              Urethra:  normal appearing urethra with no masses, tenderness or lesions              Bartholin's and Skene's: normal                 Vagina: normal appearing vagina with normal color and discharge, no lesions              Cervix: normal, non tender              Pap taken: No. Bimanual Exam:  Uterus:  normal size, contour, position, consistency, mobility, non-tender and anteflexed              Adnexa: normal adnexa and no mass, fullness,  tenderness               Rectovaginal: Confirms               Anus:  normal sphincter tone, no lesions, hemorrhoid tags noted  A:  Well Woman with normal exam  Contraception Nuvaring desired  Cholesectomy for gall stones  Left ankle fracture no issues now  P:   Reviewed health and wellness pertinent to exam  Rx Nuvaring see order  Pap smear not  taken today  Follow up as indicated   counseled on breast self exam, adequate intake of calcium and vitamin D, diet and exercise  return annually or prn  An After Visit Summary was printed and given to the patient.

## 2014-06-28 NOTE — Patient Instructions (Signed)

## 2014-07-01 NOTE — Progress Notes (Signed)
Reviewed personally.  M. Suzanne Jailin Manocchio, MD.  

## 2014-07-06 ENCOUNTER — Encounter: Payer: Self-pay | Admitting: Certified Nurse Midwife

## 2014-10-24 ENCOUNTER — Encounter: Payer: Self-pay | Admitting: Certified Nurse Midwife

## 2014-10-25 ENCOUNTER — Other Ambulatory Visit: Payer: Self-pay | Admitting: Endocrinology

## 2014-10-25 DIAGNOSIS — E049 Nontoxic goiter, unspecified: Secondary | ICD-10-CM

## 2014-11-02 ENCOUNTER — Ambulatory Visit
Admission: RE | Admit: 2014-11-02 | Discharge: 2014-11-02 | Disposition: A | Discharge status: Home / Self Care | Payer: Managed Care, Other (non HMO) | Source: Ambulatory Visit | Attending: Endocrinology | Admitting: Endocrinology

## 2014-11-02 DIAGNOSIS — E049 Nontoxic goiter, unspecified: Secondary | ICD-10-CM

## 2014-11-29 ENCOUNTER — Other Ambulatory Visit: Payer: Self-pay | Admitting: Endocrinology

## 2014-11-29 DIAGNOSIS — E049 Nontoxic goiter, unspecified: Secondary | ICD-10-CM

## 2014-11-30 IMAGING — US US RENAL
1 series · 14 of 25 positions shown · non-contrast
Comparison: US ABDOMEN LIMITED RUQ/ASCITES dated 03/10/2014

CLINICAL DATA: Caliectasis identified in right upper kidney by
ultrasound.

EXAM:
RENAL/URINARY TRACT ULTRASOUND COMPLETE

[Series 1: us renal · 0.24mm/px · 14 of 33 slices shown]
[im 1/33]
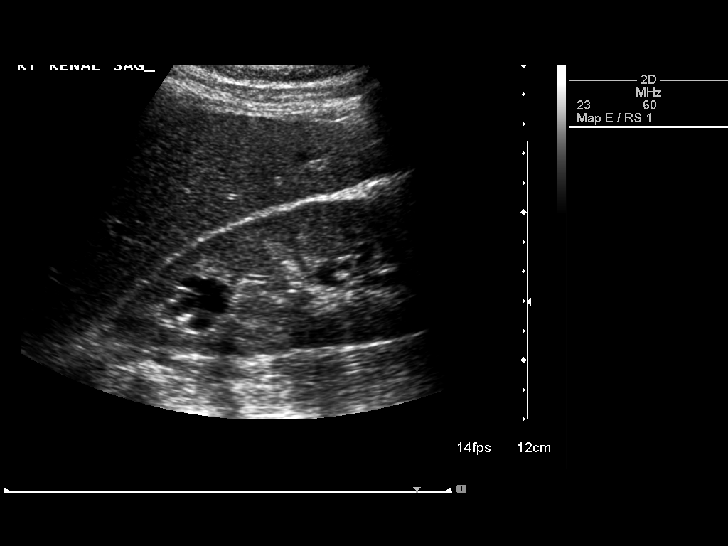
[im 3/33]
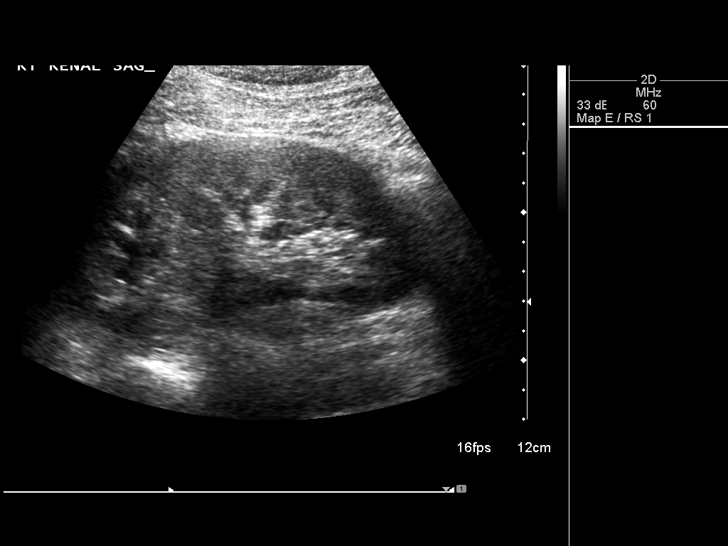
[im 6/33]
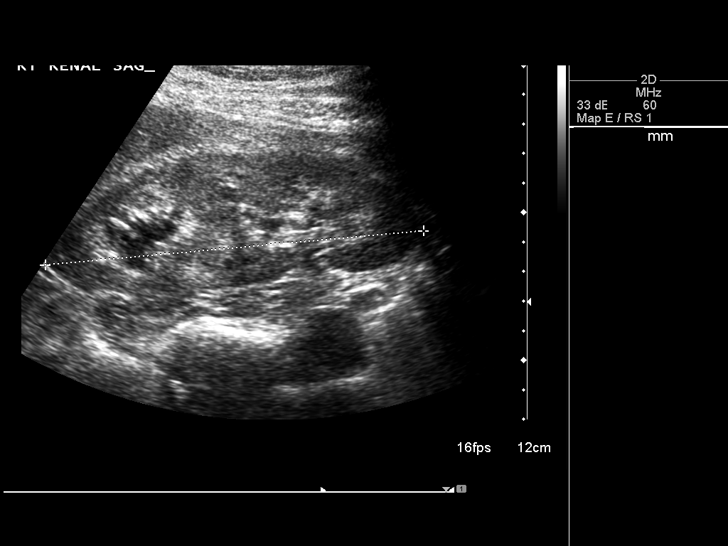
[im 9/33]
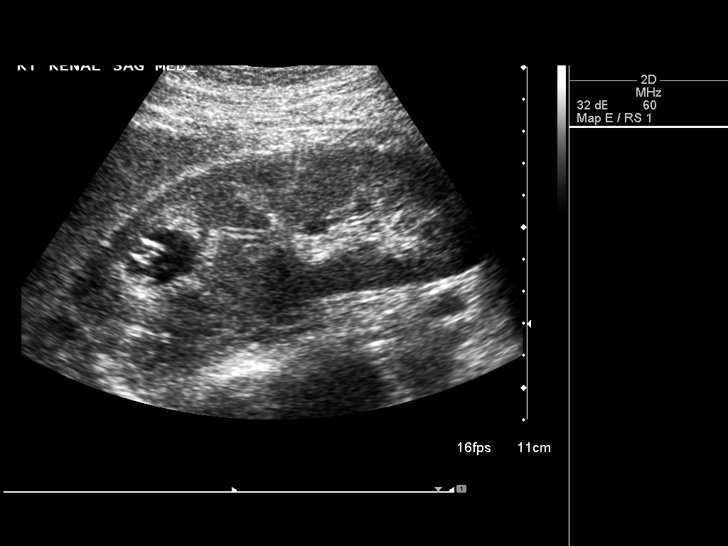
[im 11/33]
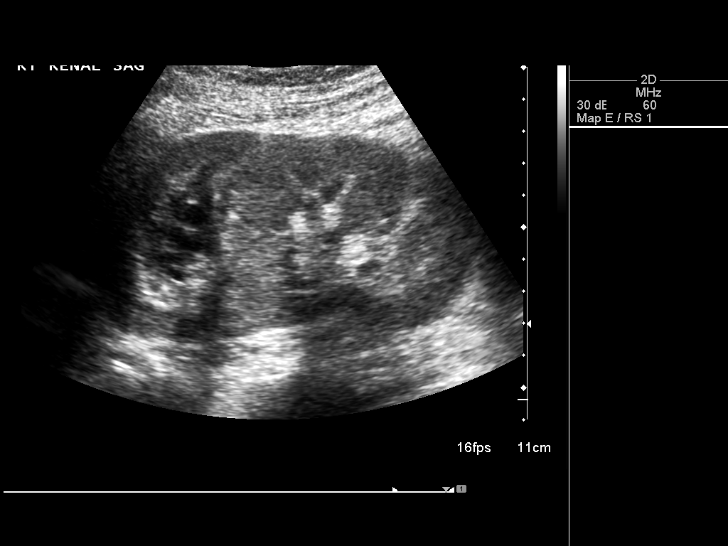
[im 13/33]
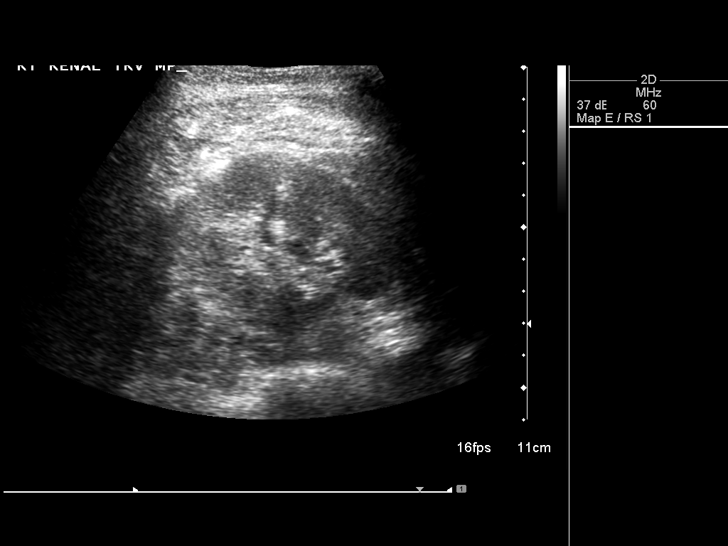
[im 15/33]
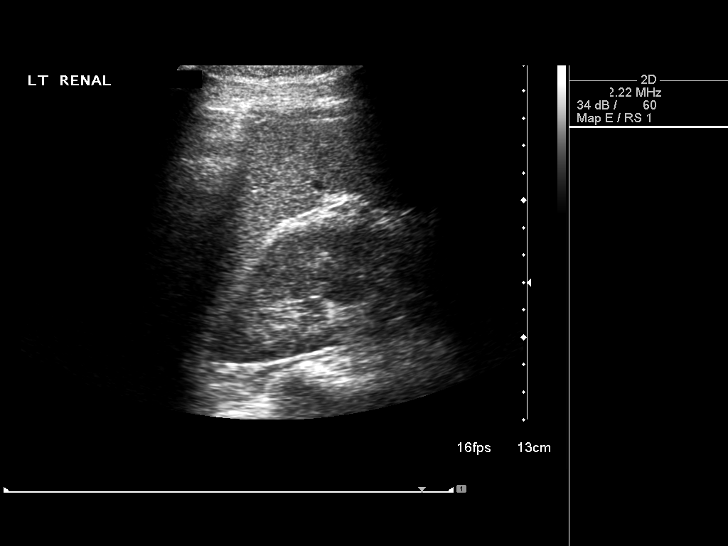
[im 18/33]
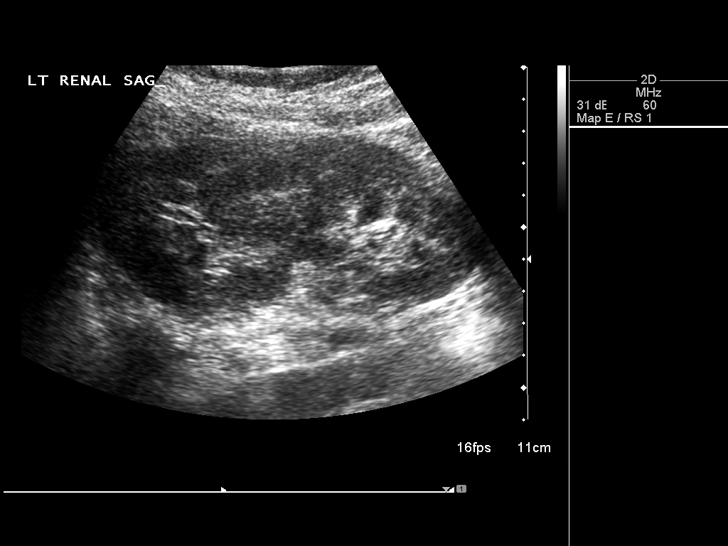
[im 21/33]
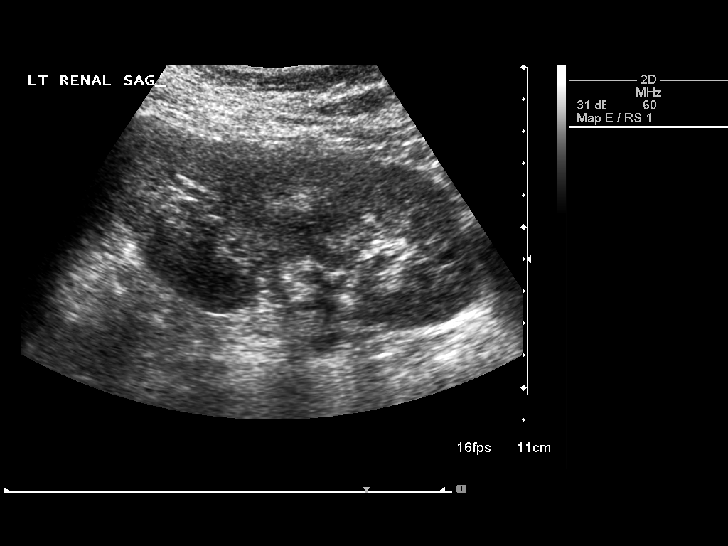
[im 22/33]
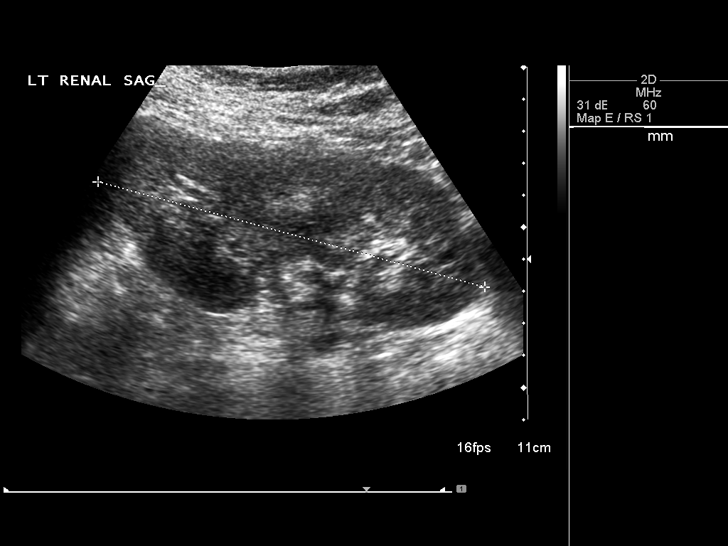
[im 25/33]
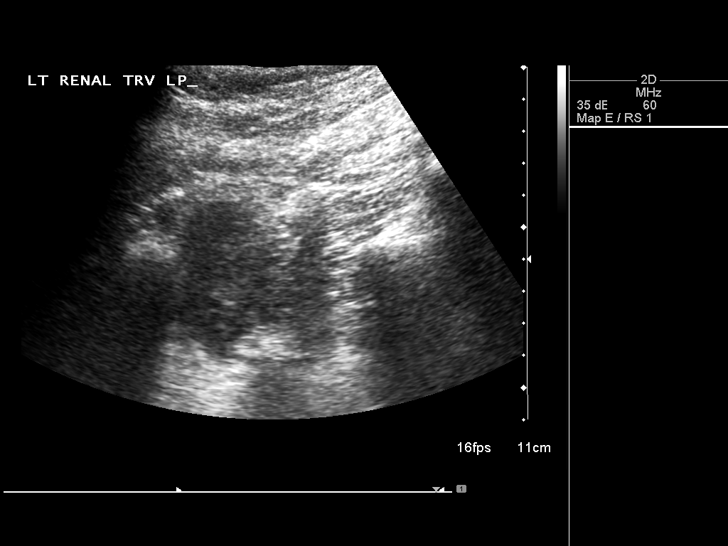
[im 27/33]
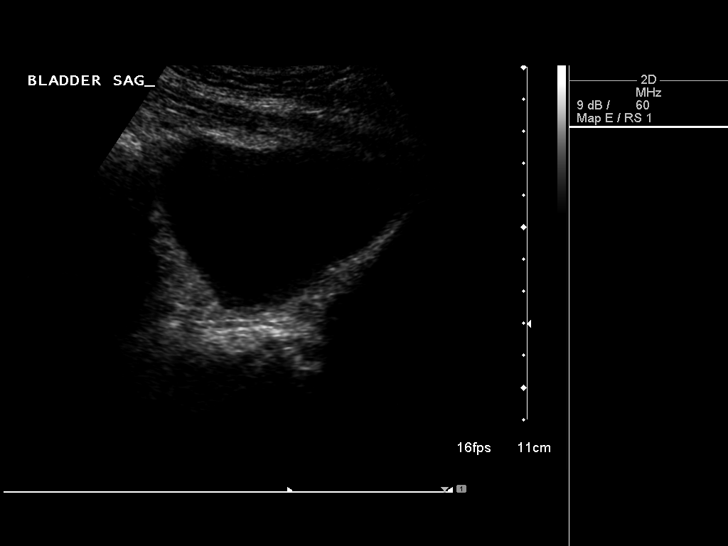
[im 30/33]
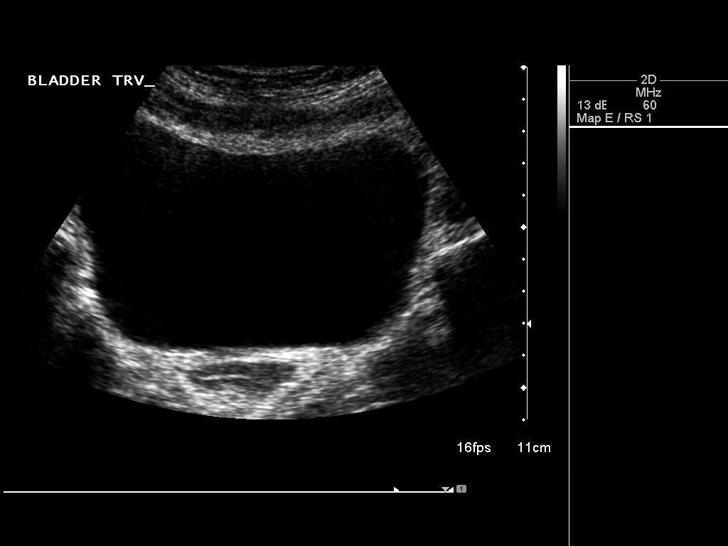
[im 33/33]
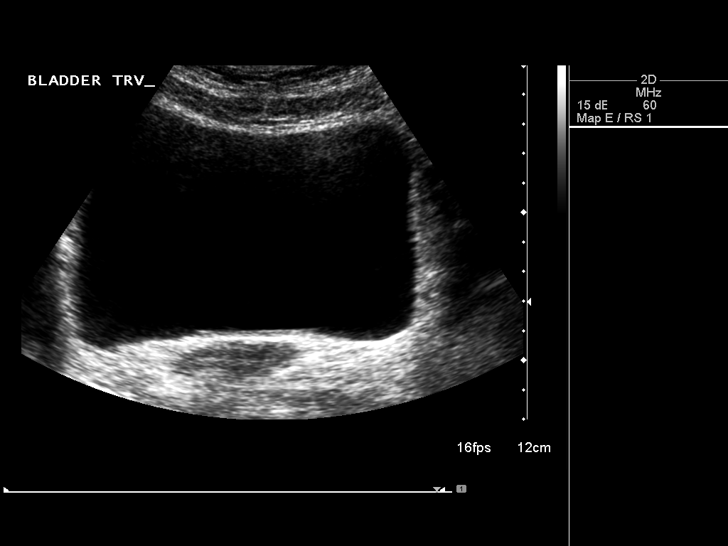

[14 of 25 positions shown; findings below may reference images not displayed]

FINDINGS: Right Kidney:

Length: 12.9 cm. Compared to the prior study, there is some
persistent mild prominence of the upper pole collecting system of
the right kidney. There may be a component of caliceal diverticulum
at this level. There also suggestion of some mildly increased
echogenicity along the peripheral margin of the collecting system
which may represent some small renal calculi. Echogenicity within
normal limits. No mass or hydronephrosis visualized.

Left Kidney:

Length: 12.5 cm. Echogenicity within normal limits. No mass or
hydronephrosis visualized.

Bladder:

Appears normal for degree of bladder distention.
IMPRESSION: Persistent mildly dilated upper pole collecting system of the right
kidney. There may be a component of caliceal diverticulum as well as
some subtle renal calculi. If the patient is symptomatic or has
abnormalities on urinalysis, urologic evaluation may be helpful.

## 2014-12-01 ENCOUNTER — Ambulatory Visit
Admission: RE | Admit: 2014-12-01 | Discharge: 2014-12-01 | Disposition: A | Discharge status: Home / Self Care | Payer: Managed Care, Other (non HMO) | Source: Ambulatory Visit | Attending: Endocrinology | Admitting: Endocrinology

## 2014-12-01 ENCOUNTER — Other Ambulatory Visit: Payer: Self-pay | Admitting: Endocrinology

## 2014-12-01 DIAGNOSIS — E049 Nontoxic goiter, unspecified: Secondary | ICD-10-CM

## 2014-12-14 IMAGING — RF DG CHOLANGIOGRAM OPERATIVE
1 series · 10 of 10 positions shown · non-contrast
Comparison: None.

CLINICAL DATA: Cholecystitis

EXAM:
INTRAOPERATIVE CHOLANGIOGRAM
TECHNIQUE: Cholangiographic images from the C-arm fluoroscopic device were
submitted for interpretation post-operatively. Please see the
procedural report for the amount of contrast and the fluoroscopy
time utilized.

[Series 1: run · 4 acquisitions, 10 frames shown]
[im 1/4]
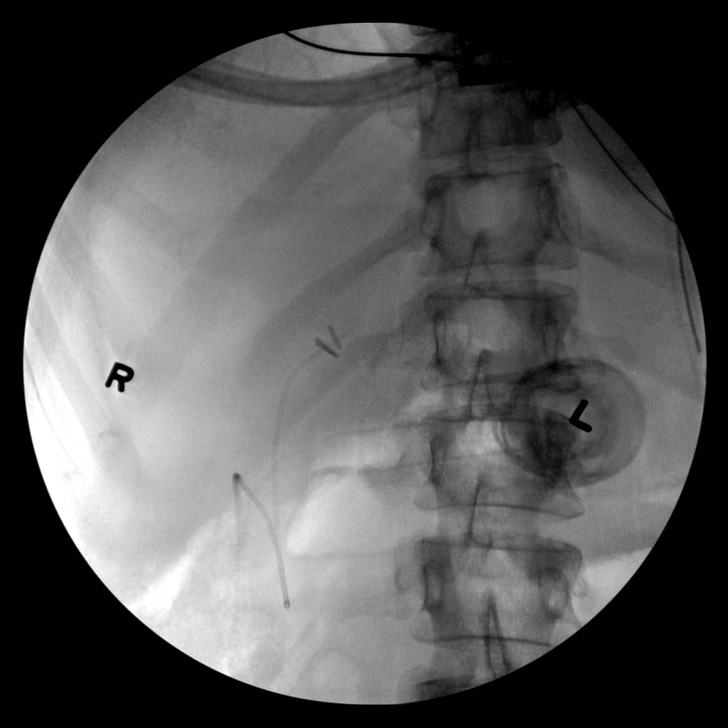
[im 1/4]
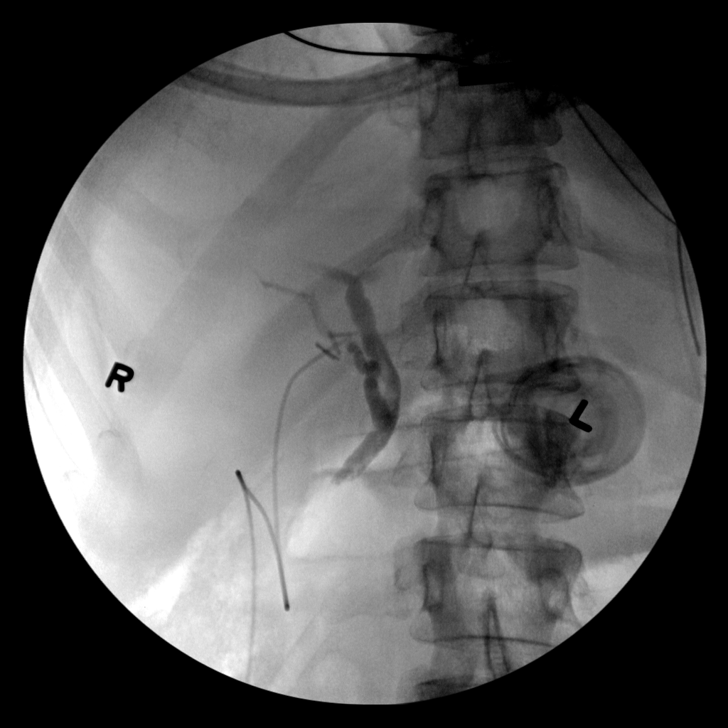
[im 1/4]
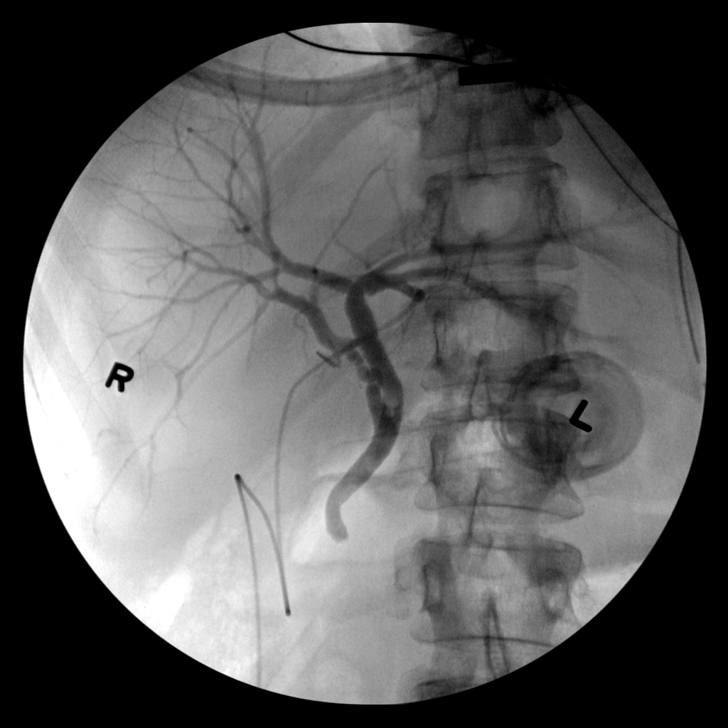
[im 1/4]
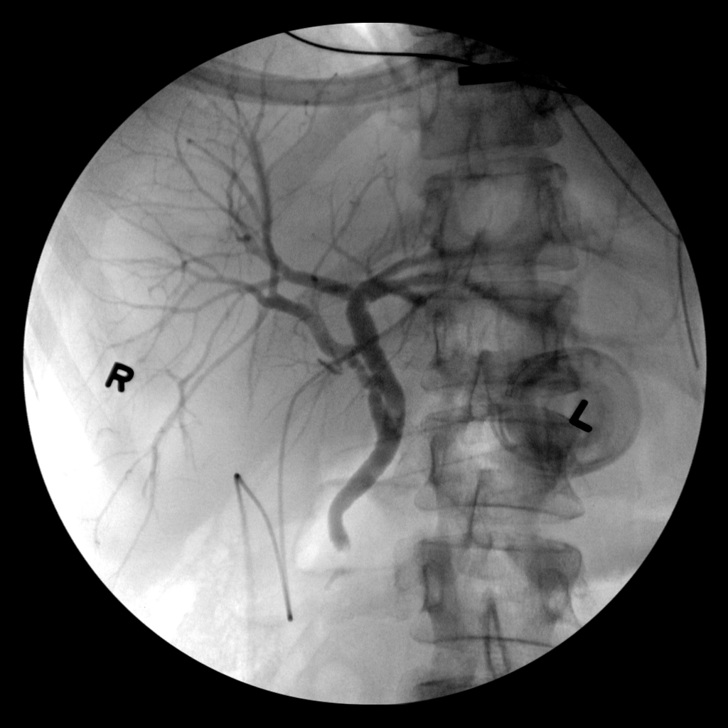
[im 2/4]
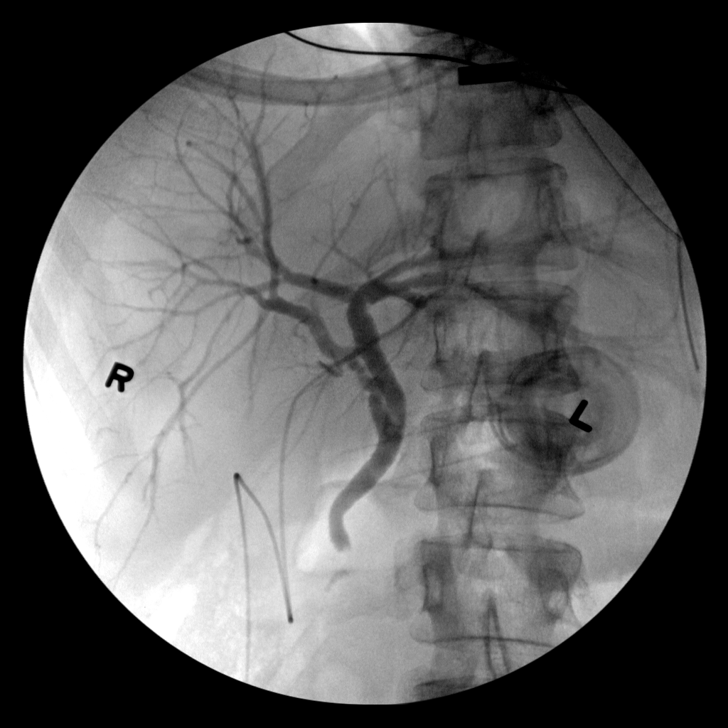
[im 3/4]
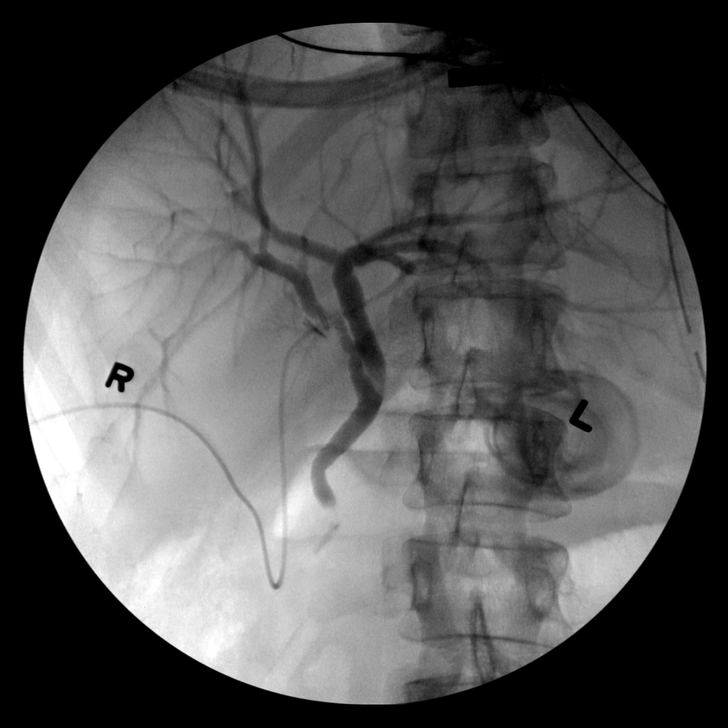
[im 3/4]
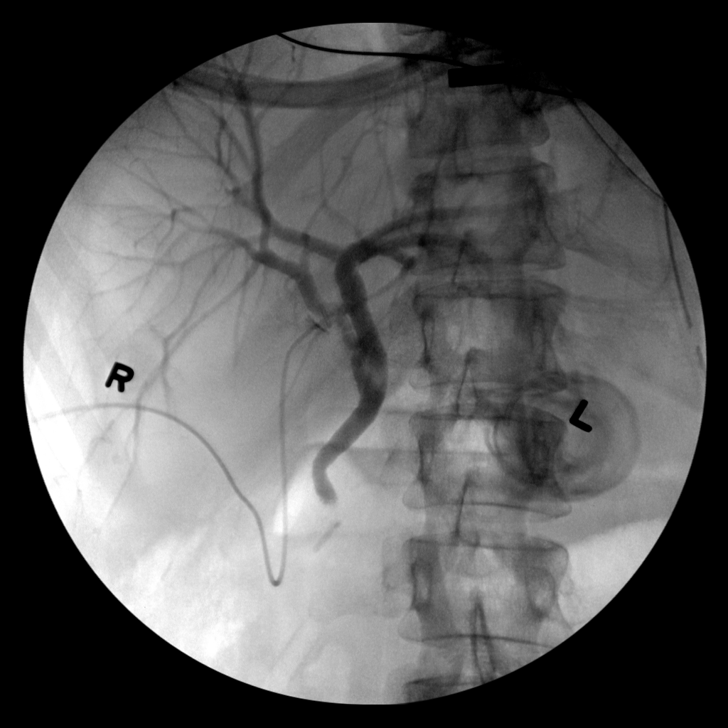
[im 3/4]
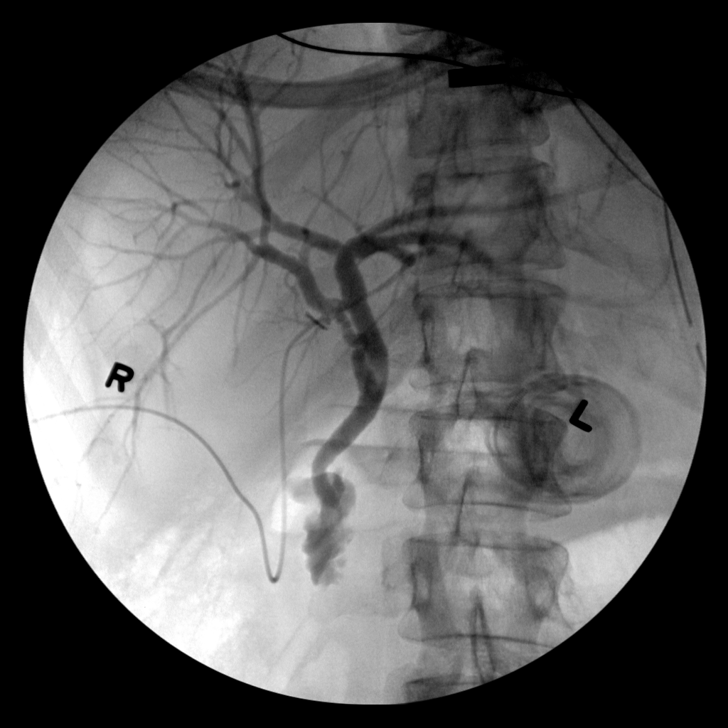
[im 3/4]
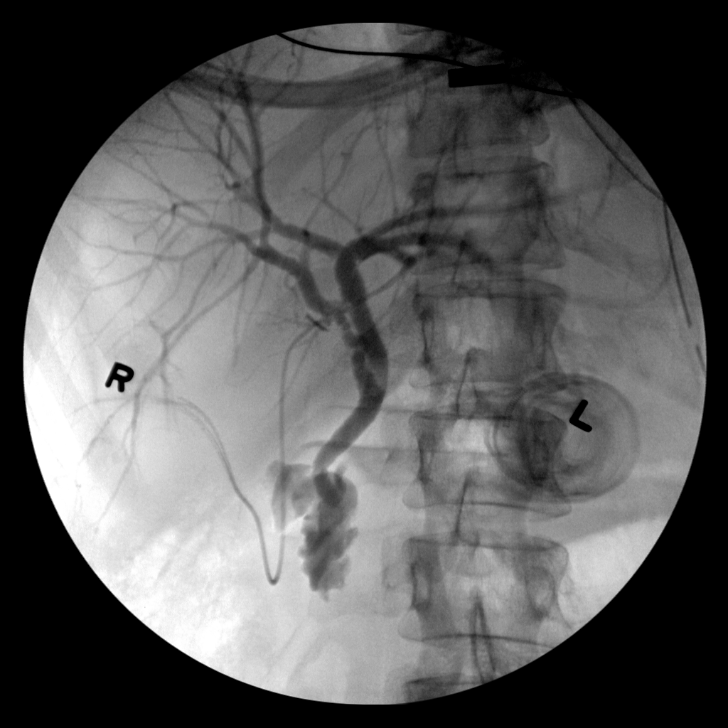
[im 4/4]
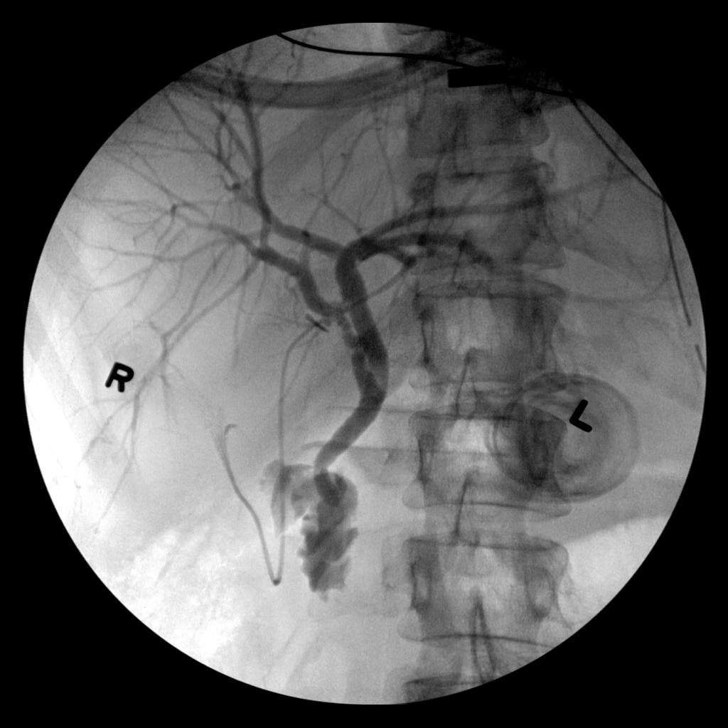

[10 of 10 positions shown; findings below may reference images not displayed]

FINDINGS: Injection into the cystic duct remnant reveals opacification of the
common hepatic and common bile ducts. There is a filling defect in
the distal aspect of the common hepatic duct just above the entrance
of the cystic duct which appears somewhat low lying. Some variant
anatomy is noted involving the right lobe of the liver. Two main
right hepatic ducts are noted. Some of vague early images also
suggest a filling defect at the ampulla of moderate although
contrast material does flow into the duodenum and later images
suggest this filling defect has resolved. Correlation with the
operative findings is recommended.

## 2015-01-03 ENCOUNTER — Telehealth: Payer: Self-pay | Admitting: Certified Nurse Midwife

## 2015-01-03 NOTE — Telephone Encounter (Signed)
Routing to Deborah S. Leonard CNM for review and advise. 

## 2015-01-03 NOTE — Telephone Encounter (Signed)
I feel if Dentist who can write Rx should give her Rx since they are the ones recommending it.

## 2015-01-03 NOTE — Telephone Encounter (Signed)
Pt saw dentist (Dr. Billey GoslingMargaret Szott)  and they recommend she start a regular antacid (prilosec). Pt does not have pcp and requests D.Darcel BayleyLeonard send rx if possible. Pt states more cost effective if rx'd for insurance purposes.  Pharmacy: [facility]

## 2015-01-04 NOTE — Telephone Encounter (Signed)
Left message to call Kasondra Junod at [phone]. 

## 2015-01-18 NOTE — Telephone Encounter (Signed)
Spoke with patient. Advised patient of message as seen below from Verner Choleborah S. Leonard CNM. "They are not able to write prescriptions like that and I do not have a primary care doctor. You don't need to say any more. I need to schedule an appointment for another reason with her." Patient hesitant to discuss. Appointment scheduled for 01/24/15 at 9:15am with Verner Choleborah S. Leonard CNM. Agreeable to date and time.  Routing to provider for final review. Patient agreeable to disposition. Will close encounter

## 2015-01-24 ENCOUNTER — Ambulatory Visit (INDEPENDENT_AMBULATORY_CARE_PROVIDER_SITE_OTHER): Payer: BLUE CROSS/BLUE SHIELD | Admitting: Certified Nurse Midwife

## 2015-01-24 ENCOUNTER — Encounter: Payer: Self-pay | Admitting: Certified Nurse Midwife

## 2015-01-24 VITALS — BP 90/60 | HR 68 | Resp 16 | Ht 70.0 in | Wt 151.0 lb

## 2015-01-24 DIAGNOSIS — Z3049 Encounter for surveillance of other contraceptives: Secondary | ICD-10-CM

## 2015-01-24 DIAGNOSIS — N920 Excessive and frequent menstruation with regular cycle: Secondary | ICD-10-CM

## 2015-01-24 MED ORDER — ETONOGESTREL-ETHINYL ESTRADIOL 0.12-0.015 MG/24HR VA RING: VAGINAL_RING | VAGINAL | Status: DC

## 2015-01-24 NOTE — Progress Notes (Signed)
34 y.o. Married Caucasian [id number] here for evaluation of Nuvaring contraception. Menses duration 6 days with heavy flow. Patient using medication as prescribed. Patient has noticed PMS symptoms have increased starting about midcycle and continues until menses starts. Feels very irritable, craving salt at times and no myself. Denies any anemia history and has thyroid checked every 6 months due to nodules. Patient loves Nuvaring and feels so much better on it, but would like for cycles to decrease in length. Patient also has noted waking up or having trouble going to sleep with feeling tightness in throat. Concerned it might be her heart, but no chest pain, SOB or other symptoms. Sees PCP yearly. Would like to check this out. Otherwise busy with children and outdoor activities and employment. No other health issues.  O: Healthy female, WD WN, color good, skin warm and dry Affect: normal orientation X 3  Heart: NSRR no murmurs noted Nail bed color good   A: History of menorrhagia with Nuvaring working well, but cycles still long, previous OCP use with poor results Throat tightness ? Etiology History of Thyroid nodule under follow up with recent US, no enlargement change  P: Discussed using Nuvaring continuous and a period every 3 months. Patient would like to try. Instructions given and Rx updated for continuous use. Discussed PMS and need to decrease salt and sugary foods mid cycle until menses start. Start on B complex daily with symptoms start and stop when period occurs. Discussed she could be feeling thyroid area and heart sounds are normal. Would prefer to see cardiology to make sure no issues. If any thing positive with heart will need to stop Nuvaring, immediately, patient aware and will discontinue. Referral to cardiology.  Rv prn  35 minutes spent with patient in face to face counseling regarding PMS and Menorrhagia history.

## 2015-01-24 NOTE — Patient Instructions (Signed)

## 2015-01-25 NOTE — Progress Notes (Signed)
Reviewed personally.  M. Suzanne Emberlie Gotcher, MD.  

## 2015-01-26 ENCOUNTER — Telehealth: Payer: Self-pay

## 2015-01-26 DIAGNOSIS — R002 Palpitations: Secondary | ICD-10-CM

## 2015-01-26 NOTE — Telephone Encounter (Signed)
[facility] [phone] to see first available. Due to throat tightening and patient reports palpitations. Please see OV note from 01/24/2015 with Verner Choleborah S. Leonard CNM.   P: Discussed using Nuvaring continuous and a period every 3 months. Patient would like to try. Instructions given and Rx updated for continuous use. Discussed PMS and need to decrease salt and sugary foods mid cycle until menses start. Start on B complex daily with symptoms start and stop when period occurs. Discussed she could be feeling thyroid area and heart sounds are normal. Would prefer to see cardiology to make sure no issues. If any thing positive with heart will need to stop Nuvaring, immediately, patient aware and will discontinue. Referral to cardiology.  Cc: Cathrine MusterSabrina Franklin in regards to referral  Routing to provider for final review. Patient agreeable to disposition. Will close encounter

## 2015-02-27 ENCOUNTER — Ambulatory Visit (INDEPENDENT_AMBULATORY_CARE_PROVIDER_SITE_OTHER): Payer: BLUE CROSS/BLUE SHIELD | Admitting: Cardiology

## 2015-02-27 ENCOUNTER — Encounter: Payer: Self-pay | Admitting: Cardiology

## 2015-02-27 ENCOUNTER — Telehealth: Payer: Self-pay | Admitting: *Deleted

## 2015-02-27 VITALS — BP 102/64 | HR 55 | Ht 70.0 in | Wt 151.0 lb

## 2015-02-27 DIAGNOSIS — E7800 Pure hypercholesterolemia, unspecified: Secondary | ICD-10-CM | POA: Insufficient documentation

## 2015-02-27 DIAGNOSIS — K8010 Calculus of gallbladder with chronic cholecystitis without obstruction: Secondary | ICD-10-CM

## 2015-02-27 DIAGNOSIS — R002 Palpitations: Secondary | ICD-10-CM | POA: Insufficient documentation

## 2015-02-27 DIAGNOSIS — R42 Dizziness and giddiness: Secondary | ICD-10-CM | POA: Insufficient documentation

## 2015-02-27 NOTE — Progress Notes (Signed)
Melissa Sawyer Date of Birth:  [birth year] [facility] [address] [place], [place]  [address] [phone]        Fax   [phone]   History of Present Illness: This pleasant 34 year old woman is seen at the request of her gynecology midwife Leota Sauers for evaluation of palpitations.  The patient has a long history of palpitations.  They frequently were occurring only occasionally.  More recently they have increased in frequency and severity.  They seem to be worse in the evening.  She has not noticed any exertional component.  She feels as if her heart is racing.  She thinks that it is regular but is not sure.  When her heart races she feels lightheaded and at times has felt near syncopal.  She has not actually had syncope however.  She also has been aware of a sensation of breathing difficulty during these episodes.  Feels like she cannot take a deep breath. The patient does not drink excessive caffeine.  She does not drink excessive alcohol.  She might have one beer or one glass of wine some evenings but not every night.  She does not have any history of thyroid dysfunction and has had recent normal thyroid function studies.  Her weight is up a few pounds over the past 6 months. Her past medical history reveals that she does not have any history of high blood pressure or diabetes.  Her blood pressure tends to be in the low normal range. She has been under more stress recently.  She has had job stress as well as the stress of working full time at [employer] of [place] and also raising her children ages 83 and 36.  Her husband also is extremely busy and is holding down 2 jobs. The family history reveals that her mother is alive and has palpitations at age 82.  Her father is 62 and is in good health. The patient is on very little medication.  She is on birth control.  She also was recently placed on acid reducer because of loss of enamel on the inner aspect of her teeth which  was attributed to reflux.  Current Outpatient Prescriptions  Medication Sig Dispense Refill  . cetirizine (ZYRTEC) 10 MG tablet Take 10 mg by mouth as needed.     . etonogestrel-ethinyl estradiol (NUVARING) 0.12-0.015 MG/24HR vaginal ring Insert vaginally and leave in place for 3 consecutive weeks continuous 3 each 4  . Omeprazole (PRILOSEC PO) Take by mouth daily.     No current facility-administered medications for this visit.    Allergies  Allergen Reactions  . Doxycycline Rash    Patient Active Problem List   Diagnosis Date Noted  . Rapid palpitations 02/27/2015  . Dizziness 02/27/2015  . Hypercholesterolemia 02/27/2015  . 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 Never Smoker   Smokeless tobacco  . Not on file    History  Alcohol Use  . 2.0 oz/week  . 4 drink(s) per week    Comment: occasionally    Family History  Problem Relation Age of Onset  . Thyroid disease Mother   . Cancer Mother     thyroid  . Mitral valve prolapse Mother     Review of Systems: Constitutional: no fever chills diaphoresis or fatigue or change in weight.  Head and neck: no hearing loss, no epistaxis, no photophobia or visual disturbance. Respiratory: No cough, shortness of breath or  wheezing. Cardiovascular: No chest pain peripheral edema, positive for tachycardia palpitations Gastrointestinal: No abdominal distention, no abdominal pain, no change in bowel habits hematochezia or melena. Genitourinary: No dysuria, no frequency, no urgency, no nocturia. Musculoskeletal:No arthralgias, no back pain, no gait disturbance or myalgias. Neurological: No dizziness, no headaches, no numbness, no seizures, no syncope, no weakness, no tremors. Hematologic: No lymphadenopathy, no easy bruising. Psychiatric: No confusion, no hallucinations, no sleep disturbance.   Wt Readings from Last 3 Encounters:  02/27/15 151 lb (68.493 kg)  01/24/15 151 lb (68.493 kg)    06/28/14 142 lb (64.411 kg)    Physical Exam: Filed Vitals:   02/27/15 1424  BP: 102/64  Pulse: 55   the general appearance reveals a tall thin woman in no acute distress. The patient appears to be in no distress.  Head and neck exam reveals that the pupils are equal and reactive.  The extraocular movements are full.  There is no scleral icterus.  Mouth and pharynx are benign.  No lymphadenopathy.  No carotid bruits.  The jugular venous pressure is normal.  Thyroid is not enlarged or tender.  Chest is clear to percussion and auscultation.  No rales or rhonchi.  Expansion of the chest is symmetrical.  Heart reveals no abnormal lift or heave.  First and second heart sounds are normal.  There is no murmur gallop rub or click.  The abdomen is soft and nontender.  Bowel sounds are normoactive.  There is no hepatosplenomegaly or mass.  There are no abdominal bruits.  Extremities reveal no phlebitis or edema.  Pedal pulses are good.  There is no cyanosis or clubbing.  Neurologic exam is normal strength and no lateralizing weakness.  No sensory deficits.  Integument reveals no rash  EKG today shows sinus bradycardia at 55 bpm and is otherwise within normal limits.  There is no evidence of preexcitation.  Assessment / Plan:  Worsening tachycardia/palpitations, sometimes associated with shortness of breath and near syncope. Good general health.  She does admit to a lot of stress in her life from the stresses of full time employment and being a wife and mother of young children.  Plan: We will have her return for a two-dimensional echocardiogram.  We will also arrange for a 2 week event monitor to try to document what type of tachycardia she is experiencing. Many thanks for the opportunity to see this pleasant woman with you.  We will be in touch regarding the results of her studies.  We did not add any medication today.

## 2015-02-27 NOTE — Telephone Encounter (Signed)
Spoke with patient. Patient states she has been using the Nuvaring for 2 years now. "I just started a new method where I go without my period for two months and then have one because I use the ring continuously. I took my Nuvaring out on 2/28 and was supposed to put it back in last night. I was out of town and was not able to put a new ring in until today. I am going out of town with my husband this weekend. Do I need to use a back up method?" Advised patient with insertion of ring after 7 days will need to use a back up method until the ring has been in place for 7 days. Patient is agreeable and verbalizes understanding.  Routing to provider for final review. Patient agreeable to disposition. Will close encounter

## 2015-02-27 NOTE — Telephone Encounter (Addendum)
Patient has questions about birthcontrol

## 2015-02-27 NOTE — Patient Instructions (Addendum)
Your physician has requested that you have an echocardiogram. Echocardiography is a painless test that uses sound waves to create images of your heart. It provides your doctor with information about the size and shape of your heart and how well your heart's chambers and valves are working. This procedure takes approximately one hour. There are no restrictions for this procedure.  Your physician has recommended that you wear an event monitor. Event monitors are medical devices that record the heart's electrical activity. Doctors most often us these monitors to diagnose arrhythmias. Arrhythmias are problems with the speed or rhythm of the heartbeat. The monitor is a small, portable device. You can wear one while you do your normal daily activities. This is usually used to diagnose what is causing palpitations/syncope (passing out). 2 WEEK EVENT MONITOR   FOLLOW UP AS NEEDED

## 2015-03-07 ENCOUNTER — Encounter (INDEPENDENT_AMBULATORY_CARE_PROVIDER_SITE_OTHER): Payer: BLUE CROSS/BLUE SHIELD

## 2015-03-07 ENCOUNTER — Encounter: Payer: Self-pay | Admitting: Radiology

## 2015-03-07 ENCOUNTER — Ambulatory Visit (HOSPITAL_COMMUNITY): Payer: BLUE CROSS/BLUE SHIELD | Attending: Cardiology | Admitting: Radiology

## 2015-03-07 DIAGNOSIS — R002 Palpitations: Secondary | ICD-10-CM

## 2015-03-07 DIAGNOSIS — R42 Dizziness and giddiness: Secondary | ICD-10-CM

## 2015-03-07 NOTE — Progress Notes (Signed)
Echocardiogram performed.  

## 2015-03-07 NOTE — Progress Notes (Signed)
Patient ID: Melissa BogusJennifer H Sawyer, female   DOB: 08/15/1981, 34 y.o.   MRN: [id number] [employer] 30 day monitor applied. EOS 04-06-15

## 2015-03-09 ENCOUNTER — Institutional Professional Consult (permissible substitution): Payer: Managed Care, Other (non HMO) | Admitting: Cardiology

## 2015-03-14 ENCOUNTER — Institutional Professional Consult (permissible substitution): Payer: Managed Care, Other (non HMO) | Admitting: Cardiology

## 2015-03-28 ENCOUNTER — Telehealth: Payer: Self-pay | Admitting: Certified Nurse Midwife

## 2015-03-28 NOTE — Telephone Encounter (Signed)
Routing to Deborah S. Leonard CNM for review and advise. 

## 2015-03-28 NOTE — Telephone Encounter (Signed)
Pt wondering if Melissa Sawyer would write a Rx for the oral typhoid vaccination for her.

## 2015-03-28 NOTE — Telephone Encounter (Signed)
Patient has to obtain from health department and the type needed for specific area she will be in.

## 2015-03-29 ENCOUNTER — Encounter: Payer: Self-pay | Admitting: Cardiology

## 2015-03-29 NOTE — Telephone Encounter (Signed)
Patient notified and agreeable. Routing to provider for final review. Patient agreeable to disposition. Will close encounter

## 2015-04-05 ENCOUNTER — Encounter: Payer: Self-pay | Admitting: Physician Assistant

## 2015-04-05 ENCOUNTER — Ambulatory Visit (INDEPENDENT_AMBULATORY_CARE_PROVIDER_SITE_OTHER): Payer: BLUE CROSS/BLUE SHIELD | Admitting: Physician Assistant

## 2015-04-05 VITALS — BP 96/58 | HR 67 | Temp 98.7°F | Resp 16 | Ht 71.0 in | Wt 154.2 lb

## 2015-04-05 DIAGNOSIS — Z23 Encounter for immunization: Secondary | ICD-10-CM

## 2015-04-05 MED ORDER — TYPHOID VACCINE PO CPDR: 1.0000 | DELAYED_RELEASE_CAPSULE | ORAL | Status: DC

## 2015-04-05 NOTE — Progress Notes (Signed)
[facility] [address], [place] [address] [id number]  Date:  04/05/2015   Name:  Melissa Sawyer   DOB:  1981-09-06   MRN:  [id number]  PCP:  No PCP Per Patient    Chief Complaint: immunization review   History of Present Illness:  Melissa Sawyer is a 34 y.o. very pleasant female patient who presents with the following:  Patient is here today for hepatitis A vaccine and typhoid fever vaccination.  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  She has not had the vaccine before. Patient currently uses a PCP for annual exams.  Patient reports that she has been concerned of her mood and fatigue over the last few months.  She has had hypoglycemic events that lead her to be incredibly fatigued and irritable.  She reports that she has documented hypoglycemic readings.  Patient has reported this to her OBGYN, and received thyroid testing 2 days ago, but has no results.  She also has a visit with an endocrinologist next week.     Patient Active Problem List   Diagnosis Date Noted  . Rapid palpitations 02/27/2015  . Dizziness 02/27/2015  . Hypercholesterolemia 02/27/2015  . Chronic cholecystitis with calculus 03/18/2014    Past Medical History  Diagnosis Date  . Enlarged thyroid     sees dr Romero Belling  . Alopecia   . Anxiety   . Hypoglycemic reaction     has severe reaction if blood sugar drops  . PONV (postoperative nausea and vomiting)     motion sickness,extreme nausea  . Foot fracture, left     Past Surgical History  Procedure Laterality Date  . Tonsillectomy    . Cystectomy  age 61    left hand  . Finger nail surgery      removed thumb nail  . Cholecystectomy N/A 04/04/2014    Procedure: LAPAROSCOPIC CHOLECYSTECTOMY WITH INTRAOPERATIVE CHOLANGIOGRAM;  Surgeon: Wilmon Arms. Corliss Skains, MD;  Location: [facility] OR;  Service: General;  Laterality: N/A;  . Refractive surgery    . Cholecystectomy      History   Substance Use Topics  . Smoking status: Never Smoker   . Smokeless tobacco: Not on file  . Alcohol Use: 2.0 oz/week    4 drink(s) per week     Comment: occasionally    Family History  Problem Relation Age of Onset  . Thyroid disease Mother   . Cancer Mother     thyroid  . Mitral valve prolapse Mother     Allergies  Allergen Reactions  . Doxycycline Rash    Medication list has been reviewed and updated.  Current Outpatient Prescriptions on File Prior to Visit  Medication Sig Dispense Refill  . cetirizine (ZYRTEC) 10 MG tablet Take 10 mg by mouth as needed.     . etonogestrel-ethinyl estradiol (NUVARING) 0.12-0.015 MG/24HR vaginal ring Insert vaginally and leave in place for 3 consecutive weeks continuous 3 each 4  . Omeprazole (PRILOSEC PO) Take by mouth daily.     No current facility-administered medications on file prior to visit.    Review of Systems: ROS otherwise unremarkable unless listed above.    Physical Examination: Filed Vitals:   04/05/15 1613  BP: 96/58  Pulse: 67  Temp: 98.7 F (37.1 C)  Resp: 16   Filed Vitals:   04/05/15 1613  Height:  (1.803 m)  Weight: 154 lb 3.2 oz (69.945 kg)   Body  mass index is 21.52 kg/(m^2). Ideal Body Weight: Weight in (lb) to have BMI = 25: 178.9  Physical Exam  Constitutional: She is oriented to person, place, and time. She appears well-developed and well-nourished. No distress.  HENT:  Head: Normocephalic and atraumatic.  Right Ear: External ear normal.  Eyes: Conjunctivae and EOM are normal. Pupils are equal, round, and reactive to light. Right eye exhibits no discharge. Left eye exhibits no discharge. No scleral icterus.  Cardiovascular: Normal rate, regular rhythm and normal heart sounds.  Exam reveals no friction rub.   No murmur heard. Pulmonary/Chest: Effort normal and breath sounds normal. No respiratory distress. She has no wheezes.  Musculoskeletal: Normal range of motion.  Neurological: She is  alert and oriented to person, place, and time.  Skin: Skin is warm and dry. She is not diaphoretic.  Psychiatric: She has a normal mood and affect. Her behavior is normal.   Assessment and Plan: 34 year old female is here today for desire to have hepatitis A vaccine and typhoid fever vaccine. -Abnormal hypoglycemic and thyroid being followed by OBGYN and endocrinologist referral.  Advised that patient should consider having a family PCP to follow her health, as it is becoming more complex in addition to familial hx.   -Future order for hepatitis A vaccine placed for future 6 month.  Staff advised she can come to family office 104 for vaccine only placement.  Pt will return in 6 months.   -Typhoid vaccine oral medication given  Need for hepatitis A vaccination - Plan: Hepatitis A vaccine adult IM, typhoid (VIVOTIF) DR capsule, Hepatitis A vaccine adult IM  Need for immunization against typhoid  Trena PlattStephanie English, PA-C [facility] 4/15/20168:32 PM

## 2015-04-05 NOTE — Patient Instructions (Signed)
It was nice to meet you.   Please let us know if you would like to be followed here as your primary care facility.   You will return in 6 months for your 2nd hepatitis vaccine injection.  Have a great trip!!

## 2015-04-07 ENCOUNTER — Telehealth: Payer: Self-pay | Admitting: *Deleted

## 2015-04-07 NOTE — Telephone Encounter (Signed)
Advised patient of results   Dr. Patty SermonsBrackbill reviewed event monitor  Interpretation: NSR, occasional PAC's. No SVT, atrial fibrillation, or dangerous arrhythmia

## 2015-07-10 ENCOUNTER — Telehealth: Payer: Self-pay | Admitting: Certified Nurse Midwife

## 2015-07-10 DIAGNOSIS — Z3049 Encounter for surveillance of other contraceptives: Secondary | ICD-10-CM

## 2015-07-10 NOTE — Telephone Encounter (Signed)
I spoke with patient and she said that she has been using the Nuvaring continuously, however her last refill only gives her enough for 3 weeks a month. She is requesting a new RX that will allow her to use it continuously again. -eh

## 2015-07-10 NOTE — Telephone Encounter (Signed)
Patient calling to check on Nuvaring Rx instructions. She says they do not match what her provider told her.

## 2015-07-11 MED ORDER — ETONOGESTREL-ETHINYL ESTRADIOL 0.12-0.015 MG/24HR VA RING: VAGINAL_RING | VAGINAL | Status: DC

## 2015-07-11 NOTE — Telephone Encounter (Signed)
Patient said that the cardiologist said everything was great. -eh

## 2015-07-11 NOTE — Telephone Encounter (Signed)
Please check on patient final result with cardiology, if negative will do continuous update on RX

## 2015-07-14 IMAGING — US US SOFT TISSUE HEAD/NECK
1 series · 13 of 25 positions shown · non-contrast
Comparison: 05/27/2013, 11/28/2010

CLINICAL DATA: Thyroid goiter, thyroid nodules, subsequent
encounter

EXAM:
THYROID ULTRASOUND
TECHNIQUE: Ultrasound examination of the thyroid gland and adjacent soft
tissues was performed.

[Series 1: us soft tissue head/neck · 0.09mm/px · 13 of 64 slices shown]
[im 1/64]
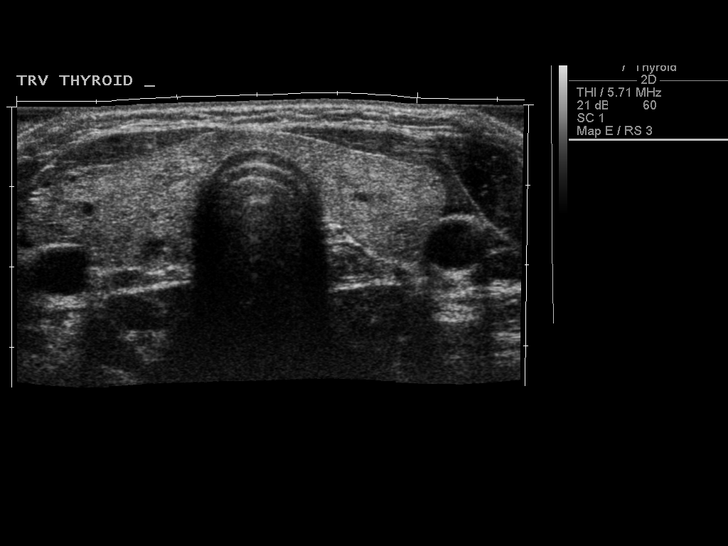
[im 6/64]
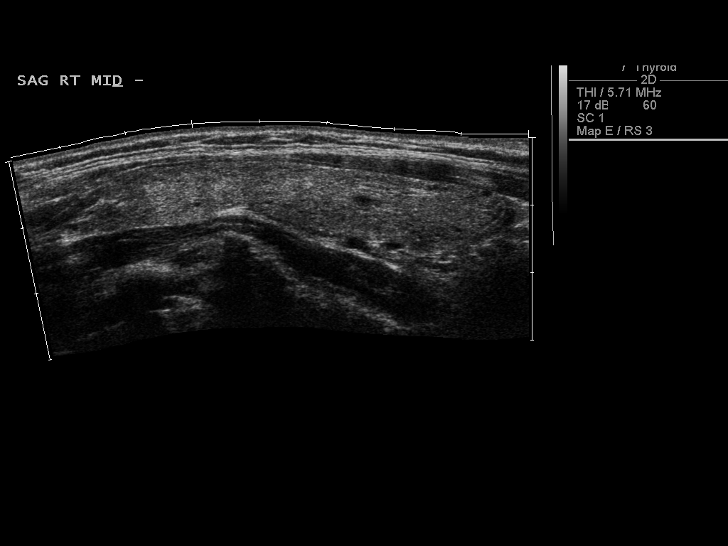
[im 11/64]
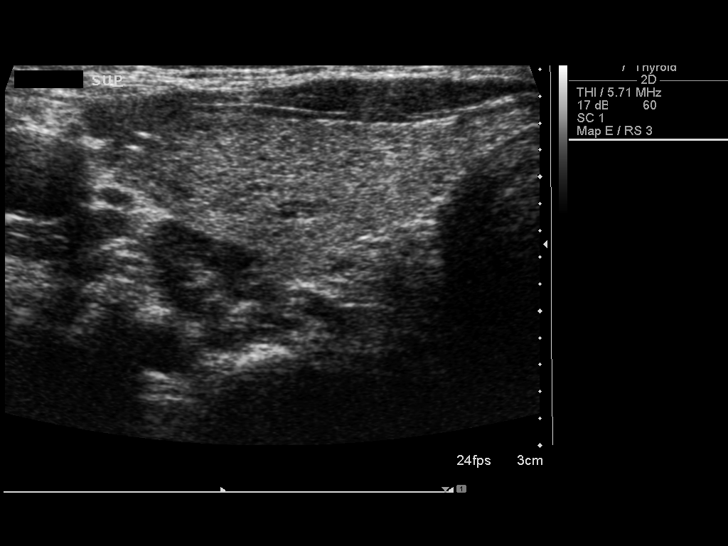
[im 16/64]
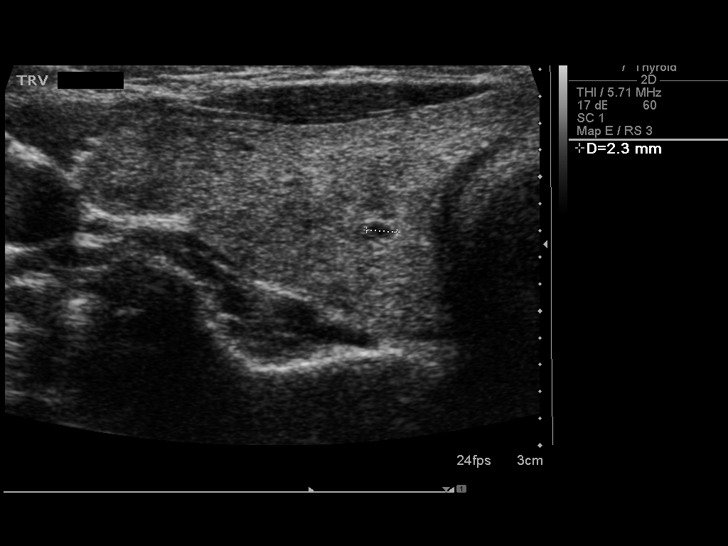
[im 22/64]
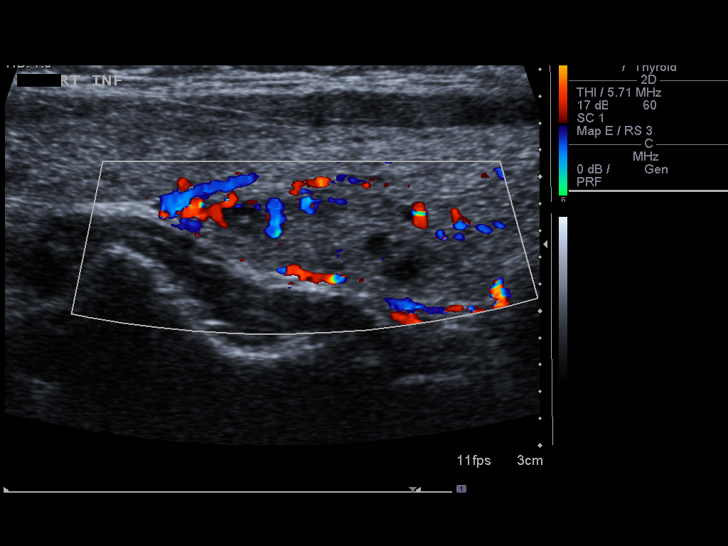
[im 27/64]
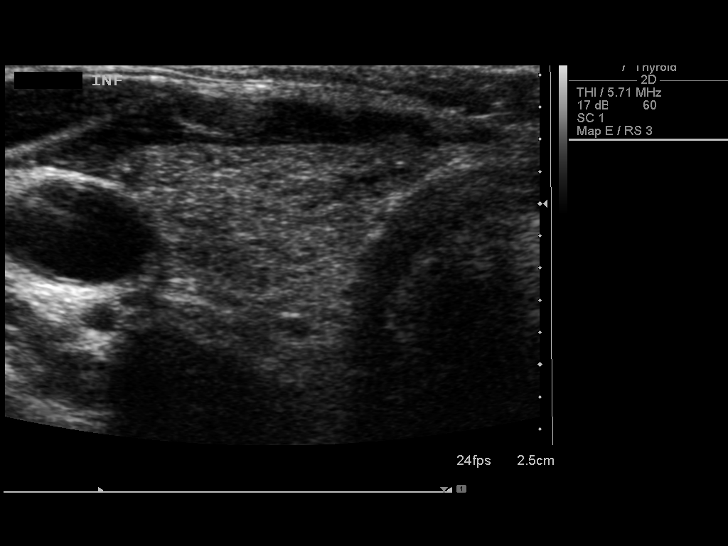
[im 32/64]
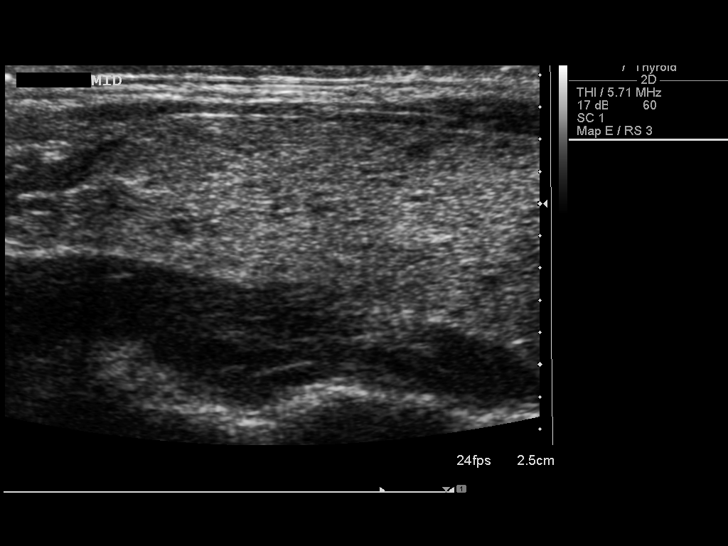
[im 37/64]
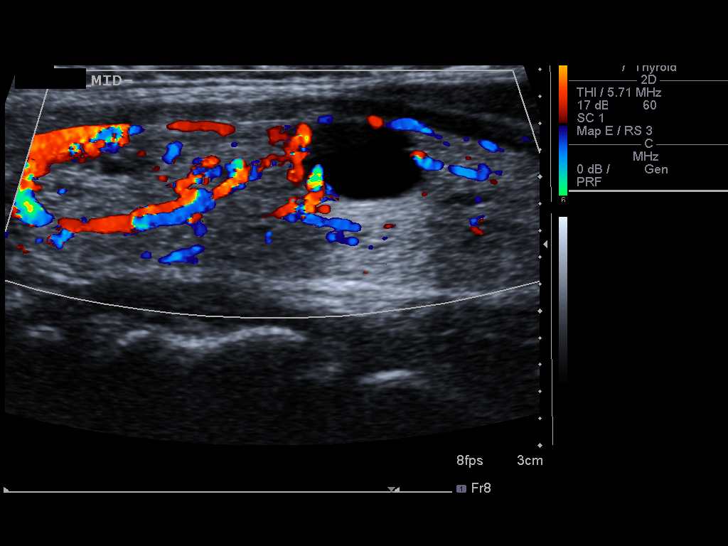
[im 43/64]
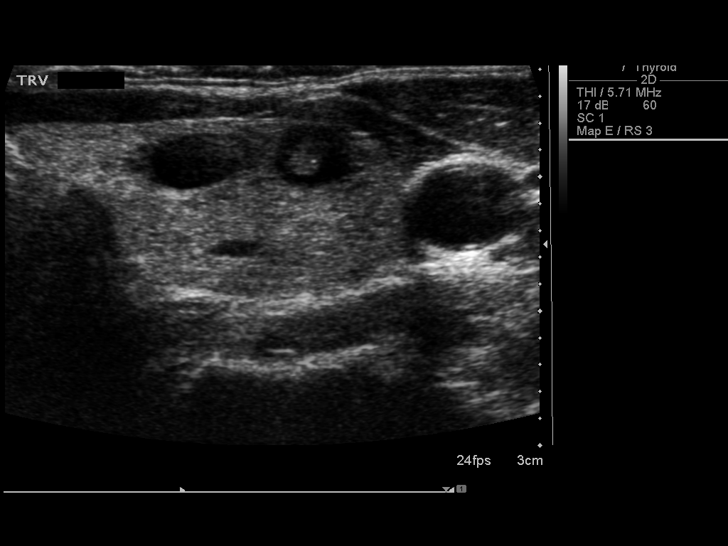
[im 48/64]
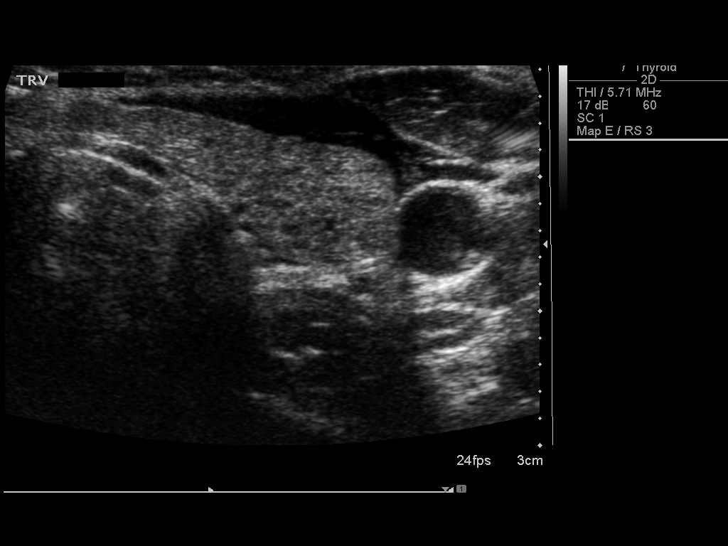
[im 53/64]
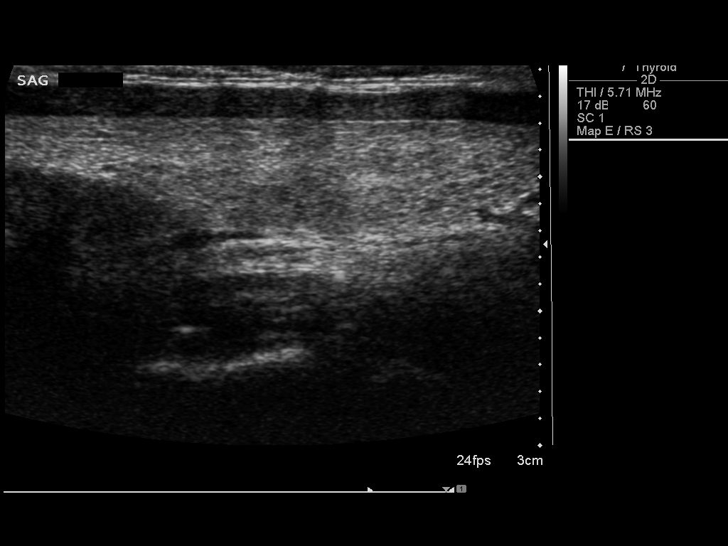
[im 58/64]
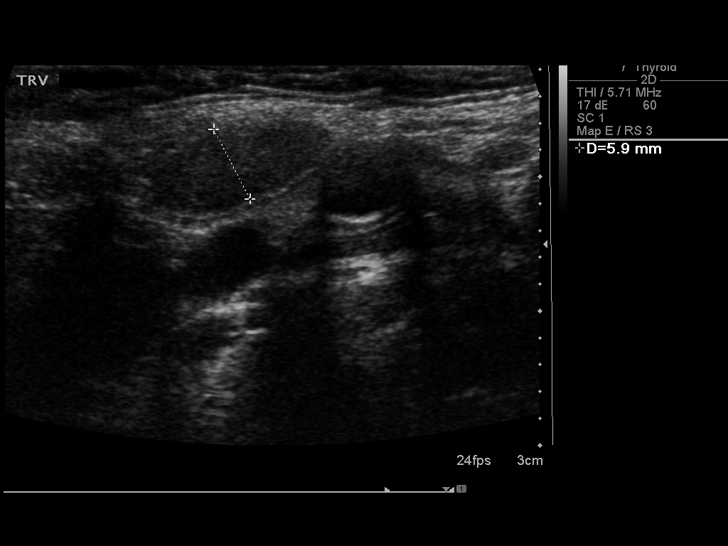
[im 64/64]
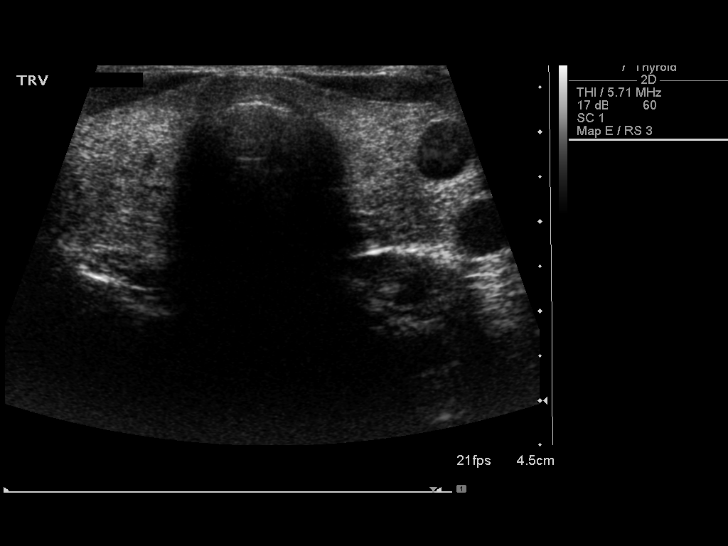

[13 of 25 positions shown; findings below may reference images not displayed]

FINDINGS: Right thyroid lobe

Measurements: 6.4 x 1.1 x 2.3 cm. Interval development of a subtle
ill-defined hypoechoic solid nodule in the right lower pole
measuring 1.5 x 0.7 x 2.0 cm. This meets criteria for biopsy.

Additional scattered small hypoechoic cystic nodules in the right
lobe diffusely measure 5 mm or less in size.

Left thyroid lobe

Measurements: 5.0 x 1.2 x 2.0 cm. Stable scattered small hypoechoic
left thyroid cystic nodules all measure 9 mm less in size. No
significant interval change.

Isthmus

Thickness: 3 mm.  No nodules visualized.

Lymphadenopathy

None visualized.
IMPRESSION: 2 cm right lower pole hypoechoic ill-defined nodule.

Findings meet consensus criteria for biopsy. Ultrasound-guided fine
needle aspiration should be considered, as per the consensus
statement: Management of Thyroid Nodules Detected at US: Society of
Radiologists in Ultrasound Consensus Conference Statement. Radiology
4445; [DATE].

Other sub cm bilateral thyroid cystic nodules are stable.

## 2015-07-14 NOTE — Telephone Encounter (Signed)
Ms. Melissa Sawyer please advise refill.

## 2015-07-14 NOTE — Telephone Encounter (Signed)
Patient calling to check on the status of this request. She said, "I really need my refill today."

## 2015-08-10 ENCOUNTER — Ambulatory Visit (INDEPENDENT_AMBULATORY_CARE_PROVIDER_SITE_OTHER): Payer: BLUE CROSS/BLUE SHIELD | Admitting: Certified Nurse Midwife

## 2015-08-10 ENCOUNTER — Encounter: Payer: Self-pay | Admitting: Certified Nurse Midwife

## 2015-08-10 VITALS — BP 94/60 | HR 60 | Resp 16 | Ht 69.75 in | Wt 151.0 lb

## 2015-08-10 DIAGNOSIS — Z3049 Encounter for surveillance of other contraceptives: Secondary | ICD-10-CM

## 2015-08-10 DIAGNOSIS — E049 Nontoxic goiter, unspecified: Secondary | ICD-10-CM | POA: Diagnosis not present

## 2015-08-10 DIAGNOSIS — Z01419 Encounter for gynecological examination (general) (routine) without abnormal findings: Secondary | ICD-10-CM | POA: Diagnosis not present

## 2015-08-10 DIAGNOSIS — Z124 Encounter for screening for malignant neoplasm of cervix: Secondary | ICD-10-CM | POA: Diagnosis not present

## 2015-08-10 DIAGNOSIS — Z0000 Encounter for general adult medical examination without abnormal findings: Secondary | ICD-10-CM | POA: Diagnosis not present

## 2015-08-10 LAB — HEMOGLOBIN, FINGERSTICK: Hemoglobin, fingerstick: 12.6 g/dL (ref 12.0–16.0)

## 2015-08-10 MED ORDER — ETONOGESTREL-ETHINYL ESTRADIOL 0.12-0.015 MG/24HR VA RING: VAGINAL_RING | VAGINAL | Status: DC

## 2015-08-10 NOTE — Progress Notes (Signed)
Reviewed personally.  M. Suzanne Dillan Lunden, MD.  

## 2015-08-10 NOTE — Patient Instructions (Signed)

## 2015-08-10 NOTE — Progress Notes (Signed)
34 y.o. G100P2002 Married  Caucasian Fe here for annual exam. Periods normal, Nuvaring working well. Patient will be moving to [facility] for [employer] position. Will be moving in 1/1/2 week with children. Stressing some, but excited. . No health issues in past year. Still seeing Dr. Talmage Nap for thyroid enlargement and nodules. Sees PCP prn. Spouse supportive and will be joining in 3 months. No health concerns today  LMP 06-19-15. Pt uses nuvaring continuous.         Sexually active: Yes.    The current method of family planning is NuvaRing vaginal inserts.    Exercising: No.  exercise Smoker:  no  Health Maintenance: Pap: 06-23-13 neg HPV HR neg MMG:  2012 u/s rt breast & aspiration neg Colonoscopy:  none BMD:   none TDaP:  2014 Labs: Hgb- 12.6 Self breast exam: not done   reports that she has never smoked. She does not have any smokeless tobacco history on file. She reports that she drinks about 3.0 - 4.2 oz of alcohol per week. She reports that she does not use illicit drugs.  Past Medical History  Diagnosis Date  . Enlarged thyroid     sees dr Romero Belling  . Alopecia   . Anxiety   . Hypoglycemic reaction     has severe reaction if blood sugar drops  . PONV (postoperative nausea and vomiting)     motion sickness,extreme nausea  . Foot fracture, left     Past Surgical History  Procedure Laterality Date  . Tonsillectomy    . Cystectomy  age 32    left hand  . Finger nail surgery      removed thumb nail  . Cholecystectomy N/A 04/04/2014    Procedure: LAPAROSCOPIC CHOLECYSTECTOMY WITH INTRAOPERATIVE CHOLANGIOGRAM;  Surgeon: Wilmon Arms. Corliss Skains, MD;  Location: [facility] OR;  Service: General;  Laterality: N/A;  . Refractive surgery    . Cholecystectomy      Current Outpatient Prescriptions  Medication Sig Dispense Refill  . cetirizine (ZYRTEC) 10 MG tablet Take 10 mg by mouth as needed.     . cholestyramine (QUESTRAN) 4 G packet     . etonogestrel-ethinyl estradiol (NUVARING) 0.12-0.015  MG/24HR vaginal ring Insert vaginally and leave in place for 3 consecutive weeks continuous 4 each 5  . Omeprazole (PRILOSEC PO) Take by mouth daily.     No current facility-administered medications for this visit.    Family History  Problem Relation Age of Onset  . Thyroid disease Mother   . Cancer Mother     thyroid  . Mitral valve prolapse Mother     ROS:  Pertinent items are noted in HPI.  Otherwise, a comprehensive ROS was negative.  Exam:   BP 94/60 mmHg  Pulse 60  Resp 16  Ht 5' 9.75" (1.772 m)  Wt 151 lb (68.493 kg)  BMI 21.81 kg/m2  LMP 06/19/2015 Height: 5' 9.75" (177.2 cm) Ht Readings from Last 3 Encounters:  08/10/15 5' 9.75" (1.772 m)  04/05/15  (1.803 m)  02/27/15  (1.778 m)    General appearance: alert, cooperative and appears stated age Head: Normocephalic, without obvious abnormality, atraumatic Neck: no adenopathy, supple, symmetrical, trachea midline and thyroid enlarged and nodules palpated,no change in size by [place] noted 12/15.  Lungs: clear to auscultation bilaterally Breasts: normal appearance, no masses or tenderness, No nipple retraction or dimpling, No nipple discharge or bleeding, No axillary or supraclavicular adenopathy Heart: regular rate and rhythm Abdomen: soft, non-tender; no masses,  no  organomegaly Extremities: extremities normal, atraumatic, no cyanosis or edema Skin: Skin color, texture, turgor normal. No rashes or lesions Lymph nodes: Cervical, supraclavicular, and axillary nodes normal. No abnormal inguinal nodes palpated Neurologic: Grossly normal   Pelvic: External genitalia:  no lesions              Urethra:  normal appearing urethra with no masses, tenderness or lesions              Bartholin's and Skene's: normal                 Vagina: normal appearing vagina with normal color and discharge, no lesions              Cervix: normal              Pap taken: Yes.   Bimanual Exam:  Uterus:  normal size, contour,  position, consistency, mobility, non-tender              Adnexa: normal adnexa and no mass, fullness, tenderness               Rectovaginal: Confirms               Anus:  normal sphincter tone, no lesions  Chaperone present: Yes  A:  Well Woman with normal exam  Contraception Nuvaring desired  Screening labs due to move   Known enlarged thyroid with nodules with Endocrine management,no size change  P:   Reviewed health and wellness pertinent to exam  Rx Nuvaring see order  Lab: TSH with panel, Vitamin D  Discussed will need to take records with her from Dr. Talmage Nap for follow up to be done in [place], can request records here when needed, will need to sign release.  Pap smear as above with HPV reflex   counseled on breast self exam, adequate intake of calcium and vitamin D, diet and exercise. Wished well with move and new job!!  return annually or prn  An After Visit Summary was printed and given to the patient.

## 2015-08-11 LAB — HEMOGLOBIN A1C
Hgb A1c MFr Bld: 5.1 % (ref <5.7)
MEAN PLASMA GLUCOSE: 100 mg/dL (ref <117)

## 2015-08-11 LAB — VITAMIN D 25 HYDROXY (VIT D DEFICIENCY, FRACTURES): Vit D, 25-Hydroxy: 29 ng/mL — ABNORMAL LOW (ref 30–100)

## 2015-08-11 LAB — THYROID PANEL WITH TSH
FREE THYROXINE INDEX: 2.8 (ref 1.4–3.8)
T3 Uptake: 26 % (ref 22–35)
T4, Total: 10.7 ug/dL (ref 4.5–12.0)
TSH: 0.756 u[IU]/mL (ref 0.350–4.500)

## 2015-08-15 LAB — IPS PAP TEST WITH REFLEX TO HPV

## 2015-10-23 ENCOUNTER — Encounter: Payer: Self-pay | Admitting: Certified Nurse Midwife

## 2015-10-24 NOTE — Telephone Encounter (Signed)
Responded to patient via mychart.  Routing to provider and will close.

## 2016-10-07 ENCOUNTER — Other Ambulatory Visit: Payer: Self-pay

## 2016-10-07 DIAGNOSIS — Z3049 Encounter for surveillance of other contraceptives: Secondary | ICD-10-CM

## 2016-10-07 NOTE — Telephone Encounter (Signed)
Medication refill request: NUVARING Last AEX:  08/10/15 DL Next AEX: none scheduled Last MMG (if hormonal medication request): n/a Refill authorized: 08/10/15 4each w/5 refills; today please advise.

## 2016-10-08 MED ORDER — ETONOGESTREL-ETHINYL ESTRADIOL 0.12-0.015 MG/24HR VA RING: VAGINAL_RING | VAGINAL | 2 refills | Status: AC

## 2016-10-08 MED ORDER — ETONOGESTREL-ETHINYL ESTRADIOL 0.12-0.015 MG/24HR VA RING: VAGINAL_RING | VAGINAL | 2 refills | Status: DC

## 2016-10-08 NOTE — Telephone Encounter (Signed)
[place] out of [place], may not have established provider will refill x 2, but will need phone call to check status

## 2016-10-08 NOTE — Telephone Encounter (Signed)
Spoke with patient. She states that she has found a new provider and will be scheduling an appointment soon with him. [place] Called [facility] to cancel rx

## 2016-10-08 NOTE — Addendum Note (Signed)
Addended by: Zenovia JordanMITCHELL, Nishaan Stanke A on: 10/08/2016 10:04 AM   Modules accepted: Orders
